# Patient Record
Sex: Male | Born: 1960
Health system: Southern US, Community
[De-identification: ages and names within clinical notes are randomized; demographics above are authoritative.]

## PROBLEM LIST (undated history)

## (undated) DIAGNOSIS — M1990 Unspecified osteoarthritis, unspecified site: Secondary | ICD-10-CM

## (undated) DIAGNOSIS — T7840XA Allergy, unspecified, initial encounter: Secondary | ICD-10-CM

## (undated) DIAGNOSIS — J449 Chronic obstructive pulmonary disease, unspecified: Secondary | ICD-10-CM

## (undated) HISTORY — PX: MASS EXCISION: SHX2000

---

## 1997-06-02 ENCOUNTER — Emergency Department (HOSPITAL_COMMUNITY): Admission: EM | Admit: 1997-06-02 | Discharge: 1997-06-02 | Payer: Self-pay | Admitting: Emergency Medicine

## 2000-07-02 ENCOUNTER — Encounter: Payer: Self-pay | Admitting: Family Medicine

## 2000-07-02 ENCOUNTER — Encounter: Admission: RE | Admit: 2000-07-02 | Discharge: 2000-07-02 | Payer: Self-pay | Admitting: Family Medicine

## 2005-12-25 ENCOUNTER — Ambulatory Visit (HOSPITAL_BASED_OUTPATIENT_CLINIC_OR_DEPARTMENT_OTHER): Admission: RE | Admit: 2005-12-25 | Discharge: 2005-12-25 | Payer: Self-pay | Admitting: Surgery

## 2005-12-25 ENCOUNTER — Encounter (INDEPENDENT_AMBULATORY_CARE_PROVIDER_SITE_OTHER): Payer: Self-pay | Admitting: *Deleted

## 2007-05-17 ENCOUNTER — Emergency Department (HOSPITAL_COMMUNITY): Admission: EM | Admit: 2007-05-17 | Discharge: 2007-05-17 | Payer: Self-pay | Admitting: Emergency Medicine

## 2008-05-27 ENCOUNTER — Emergency Department (HOSPITAL_BASED_OUTPATIENT_CLINIC_OR_DEPARTMENT_OTHER): Admission: EM | Admit: 2008-05-27 | Discharge: 2008-05-27 | Payer: Self-pay | Admitting: Emergency Medicine

## 2008-05-27 ENCOUNTER — Ambulatory Visit: Payer: Self-pay | Admitting: Diagnostic Radiology

## 2010-06-23 NOTE — Op Note (Signed)
NAME:  Cory Jennings, Cory Jennings           ACCOUNT NO.:  [id number]   MEDICAL RECORD NO.:  [id number]          PATIENT TYPE:  AMB   LOCATION:  DSC                          FACILITY:  [facility]   PHYSICIAN:  Wilmon Arms. Corliss Skains, M.D. DATE OF BIRTH:  Feb 08, 1960   DATE OF PROCEDURE:  12/25/2005  DATE OF DISCHARGE:                                 OPERATIVE REPORT   PREOP DIAGNOSIS:  Right posterior shoulder lipoma.   POSTOPERATIVE DIAGNOSIS:  Right posterior shoulder submuscular lipoma.   SURGEON:  Wilmon Arms. Tsuei, M.D.   ANESTHESIA:  General via LMA.   INDICATIONS:  The patient is a healthy 51 year old male who has had a mass  on his posterior right shoulder since before 2002.  He had a CT scan at that  time which showed a lipoma beneath the right latissimus dorsi muscle.  The  patient chose not to have anything done at that time.  However, this has  continued to enlarge and is approximately 12 cm in diameter at this time.  It is uncomfortable and it is unsightly.  The patient requests removal.   DESCRIPTION OF PROCEDURE:  The patient was brought to the operating room and  placed in the supine position on the operating room table.  There was a been  bag in place.  After an adequate level of general anesthesia was obtained,  the patient was rotated so he was lying on his left side.  The beanbag was  molded to hold him in place.  Appropriate padding and straps were also  placed.  His right shoulder was then prepped with Betadine and draped in a  sterile fashion.  He had been given preoperative ciprofloxacin due to his  penicillin allergy.   The area around the lipoma was infiltrated with quarter percent Marcaine.  A  transverse incision was made directly over the palpable mass.  Dissection  was carried down to the fascia.  The fascia of the latissimus dorsi muscle  was opened transversely.  The muscle fibers were divided down to the surface  of the lipoma.  Blunt dissection was used to free up  the lipoma.  The lipoma  was delivered up into the wound.  The posterior surface of the lipoma was  then removed with cautery.  The entire lipoma was sent for pathologic  examination.  There was no rupture of the lipoma capsule.  The wound was  then inspected for hemostasis.   The fascia was closed with a running layer of 0 Vicryl suture.  3-0 Vicryl  was used to close the superficial fascia.  4-0 Monocryl was used to close  the skin in subcuticular fashion.  Prior to the closure a 19 Blake drain was  brought through a separate stab incision and placed in the retromuscular  space.  It was secured with a 2-0 nylon suture and  was placed to bulb suction.  Steri-Strips and clean dressings were applied  to the wound.  The patient was then rotated back to a flat supine position.  He was extubated and brought to the recovery room in stable condition.  All  sponge, instrument,  and needle counts were correct.      Wilmon Arms. Tsuei, M.D.  Electronically Signed     MKT/MEDQ  D:  12/25/2005  T:  12/25/2005  Job:  [id number]   cc:   L. Lupe Carney, M.D.

## 2015-03-10 DIAGNOSIS — M542 Cervicalgia: Secondary | ICD-10-CM | POA: Diagnosis not present

## 2015-03-10 DIAGNOSIS — M4722 Other spondylosis with radiculopathy, cervical region: Secondary | ICD-10-CM | POA: Diagnosis not present

## 2015-03-10 DIAGNOSIS — J439 Emphysema, unspecified: Secondary | ICD-10-CM | POA: Diagnosis not present

## 2015-03-10 DIAGNOSIS — M79641 Pain in right hand: Secondary | ICD-10-CM | POA: Diagnosis not present

## 2015-03-10 DIAGNOSIS — F17210 Nicotine dependence, cigarettes, uncomplicated: Secondary | ICD-10-CM | POA: Diagnosis not present

## 2015-03-10 MED FILL — GABAPENTIN 300 MG CAPSULE: 300 | 30 days supply | Qty: 90 | Fill #0

## 2015-03-10 MED FILL — METHYLPREDNISOLONE 4 MG TAB: 4 | 6 days supply | Qty: 21 | Fill #0

## 2015-04-21 DIAGNOSIS — M542 Cervicalgia: Secondary | ICD-10-CM | POA: Diagnosis not present

## 2015-04-21 DIAGNOSIS — M4722 Other spondylosis with radiculopathy, cervical region: Secondary | ICD-10-CM | POA: Diagnosis not present

## 2015-04-21 DIAGNOSIS — F17210 Nicotine dependence, cigarettes, uncomplicated: Secondary | ICD-10-CM | POA: Diagnosis not present

## 2015-04-21 DIAGNOSIS — M79641 Pain in right hand: Secondary | ICD-10-CM | POA: Diagnosis not present

## 2015-05-30 MED FILL — GABAPENTIN 300 MG CAPSULE: 300 | 30 days supply | Qty: 90 | Fill #1

## 2016-10-19 DIAGNOSIS — H5203 Hypermetropia, bilateral: Secondary | ICD-10-CM | POA: Diagnosis not present

## 2017-07-12 ENCOUNTER — Ambulatory Visit (INDEPENDENT_AMBULATORY_CARE_PROVIDER_SITE_OTHER): Payer: Self-pay | Admitting: Family Medicine

## 2017-07-12 VITALS — BP 105/80 | HR 86 | Temp 97.3°F | Resp 16 | Wt 145.2 lb

## 2017-07-12 DIAGNOSIS — Z0000 Encounter for general adult medical examination without abnormal findings: Secondary | ICD-10-CM

## 2017-07-12 NOTE — Patient Instructions (Signed)

## 2017-07-12 NOTE — Progress Notes (Signed)
Cory Jennings is a 57 y.o. male who presents today with concerns of a need for a physical. He denies any acute concerns but does reports a chronic history of mild COPD.  Review of Systems  Constitutional: Negative for chills, fever and malaise/fatigue.  HENT: Negative for congestion, ear discharge, ear pain, sinus pain and sore throat.   Eyes: Negative.   Respiratory: Negative for cough, sputum production and shortness of breath.   Cardiovascular: Negative.  Negative for chest pain.  Gastrointestinal: Negative for abdominal pain, diarrhea, nausea and vomiting.  Genitourinary: Negative for dysuria, frequency, hematuria and urgency.  Musculoskeletal: Negative for myalgias.  Skin: Negative.   Neurological: Negative for headaches.  Endo/Heme/Allergies: Negative.   Psychiatric/Behavioral: Negative.     O: Vitals:   07/12/17 1544  BP: 105/80  Pulse: 86  Resp: 16  Temp: (!) 97.3 F (36.3 C)  SpO2: 99%     Physical Exam  Constitutional: He is oriented to person, place, and time. Vital signs are normal. He appears well-developed and well-nourished. He is active.  Non-toxic appearance. He does not have a sickly appearance.  HENT:  Head: Normocephalic.  Right Ear: Hearing, tympanic membrane, external ear and ear canal normal.  Left Ear: Hearing, tympanic membrane, external ear and ear canal normal.  Nose: Nose normal.  Mouth/Throat: Uvula is midline and oropharynx is clear and moist.  Neck: Normal range of motion. Neck supple.  Cardiovascular: Normal rate, regular rhythm, normal heart sounds and normal pulses.  Pulmonary/Chest: Effort normal and breath sounds normal.  Abdominal: Soft. Bowel sounds are normal.  Musculoskeletal: Normal range of motion.  Lymphadenopathy:       Head (right side): No submental and no submandibular adenopathy present.       Head (left side): No submental and no submandibular adenopathy present.    He has no cervical adenopathy.  Neurological: He is  alert and oriented to person, place, and time. He has normal strength. No cranial nerve deficit or sensory deficit. He displays a negative Romberg sign.  Balance and strength intact  Psychiatric: He has a normal mood and affect. His speech is normal and behavior is normal. Judgment normal. Cognition and memory are normal.  PHQ-9 - negative  Vitals reviewed.  A: 1. Physical exam    P: Discussed smoking cessation. Exam findings, diagnosis etiology and medication use and indications reviewed with patient. Follow- Up and discharge instructions provided. No emergent/urgent issues found on exam.  Patient verbalized understanding of information provided and agrees with plan of care (POC), all questions answered.  1. Physical exam WNL

## 2017-11-15 MED FILL — PROAIR RESPICLICK INHAL PWD: 108 (90 BAS | 25 days supply | Qty: 1 | Fill #0

## 2018-01-06 ENCOUNTER — Ambulatory Visit (INDEPENDENT_AMBULATORY_CARE_PROVIDER_SITE_OTHER): Payer: Self-pay | Admitting: Family Medicine

## 2018-01-06 VITALS — BP 122/78 | HR 60 | Temp 99.1°F | Resp 20 | Wt 146.8 lb

## 2018-01-06 DIAGNOSIS — R05 Cough: Secondary | ICD-10-CM

## 2018-01-06 DIAGNOSIS — J019 Acute sinusitis, unspecified: Secondary | ICD-10-CM

## 2018-01-06 DIAGNOSIS — R059 Cough, unspecified: Secondary | ICD-10-CM

## 2018-01-06 DIAGNOSIS — Z8709 Personal history of other diseases of the respiratory system: Secondary | ICD-10-CM

## 2018-01-06 MED ORDER — PSEUDOEPH-BROMPHEN-DM 30-2-10 MG/5ML PO SYRP: 10.0000 mL | ORAL_SOLUTION | Freq: Three times a day (TID) | ORAL | 0 refills | Status: DC | PRN

## 2018-01-06 MED ORDER — AZELASTINE HCL 0.1 % NA SOLN: 1.0000 | Freq: Two times a day (BID) | NASAL | 0 refills | Status: DC

## 2018-01-06 MED ORDER — AZELASTINE HCL 0.1 % NA SOLN: 1.0000 | Freq: Two times a day (BID) | NASAL | 0 refills | Status: AC

## 2018-01-06 MED ORDER — DOXYCYCLINE HYCLATE 100 MG PO TABS: 100.0000 mg | ORAL_TABLET | Freq: Two times a day (BID) | ORAL | 0 refills | Status: DC

## 2018-01-06 MED ORDER — DOXYCYCLINE HYCLATE 100 MG PO TABS: 100.0000 mg | ORAL_TABLET | Freq: Two times a day (BID) | ORAL | 0 refills | Status: AC

## 2018-01-06 MED FILL — DOXYCYCLINE HYCLATE 100 MG: 100 | 7 days supply | Qty: 14 | Fill #0

## 2018-01-06 MED FILL — AZELASTINE HCL 137 MCG/SPRA: 137 | 50 days supply | Qty: 30 | Fill #0

## 2018-01-06 MED FILL — BROMPHENIR-PSEUDOEPHED-DM S: 30-2-10 | 4 days supply | Qty: 120 | Fill #0

## 2018-01-06 NOTE — Progress Notes (Signed)
Cory ModenaJoseph M Luallen is a 57 y.o. male who presents today with concerns of cough and congestion that began about 10 days ago. Patient reports that the symptoms started off mild and increasingly progressed. He reports using over the counter cough and congestion medications as well as an Afrin nasal spray with limited relief of symptoms. He denies any fever but does report a history of COPD and occasional forceful sneezing that relieves symptoms.  Review of Systems  Constitutional: Negative for chills, fever and malaise/fatigue.  HENT: Positive for congestion and sinus pain. Negative for ear discharge, ear pain and sore throat.   Eyes: Negative.   Respiratory: Positive for cough. Negative for sputum production and shortness of breath.   Cardiovascular: Negative.  Negative for chest pain.  Gastrointestinal: Negative for abdominal pain, diarrhea, nausea and vomiting.  Genitourinary: Negative for dysuria, frequency, hematuria and urgency.  Musculoskeletal: Negative for myalgias.  Skin: Negative.   Neurological: Negative for headaches.  Endo/Heme/Allergies: Negative.   Psychiatric/Behavioral: Negative.     O: Vitals:   01/06/18 1538  BP: 122/78  Pulse: 60  Resp: 20  Temp: 99.1 F (37.3 C)  SpO2: 98%     Physical Exam  Constitutional: He is oriented to person, place, and time. Vital signs are normal. He appears well-developed and well-nourished. He is active.  Non-toxic appearance. He does not have a sickly appearance.  HENT:  Head: Normocephalic.  Right Ear: Hearing, tympanic membrane, external ear and ear canal normal.  Left Ear: Hearing, tympanic membrane, external ear and ear canal normal.  Nose: Rhinorrhea present. Right sinus exhibits maxillary sinus tenderness and frontal sinus tenderness. Left sinus exhibits maxillary sinus tenderness and frontal sinus tenderness.  Mouth/Throat: Uvula is midline. Posterior oropharyngeal erythema present.  Neck: Normal range of motion. Neck  supple.  Cardiovascular: Normal rate, regular rhythm, normal heart sounds and normal pulses.  Pulmonary/Chest: Effort normal. He has rales in the right upper field, the right middle field, the left upper field and the left middle field.  Abdominal: Soft. Bowel sounds are normal.  Musculoskeletal: Normal range of motion.  Lymphadenopathy:       Head (right side): No submental and no submandibular adenopathy present.       Head (left side): No submental and no submandibular adenopathy present.    He has no cervical adenopathy.  Neurological: He is alert and oriented to person, place, and time.  Psychiatric: He has a normal mood and affect.  Vitals reviewed.   A: 1. Acute non-recurrent sinusitis, unspecified location   2. Cough   3. History of COPD     P: Discussed exam findings, diagnosis etiology and medication use and indications reviewed with patient. Follow- Up and discharge instructions provided. No emergent/urgent issues found on exam.  Patient verbalized understanding of information provided and agrees with plan of care (POC), all questions answered.  1. Acute non-recurrent sinusitis, unspecified location - doxycycline (VIBRA-TABS) 100 MG tablet; Take 1 tablet (100 mg total) by mouth 2 (two) times daily for 7 days. - azelastine (ASTELIN) 0.1 % nasal spray; Place 1 spray into both nostrils 2 (two) times daily. Use in each nostril as directed  2. Cough - brompheniramine-pseudoephedrine-DM 30-2-10 MG/5ML syrup; Take 10 mLs by mouth 3 (three) times daily as needed.  3. History of COPD Currently smoking- no reported maintenance medication

## 2018-01-06 NOTE — Patient Instructions (Signed)

## 2018-04-14 MED FILL — PROAIR RESPICLICK INHAL PWD: 108 (90 BAS | 25 days supply | Qty: 1 | Fill #1

## 2018-12-09 MED FILL — NICOTINE 7 MG/24HR PATCH: 7 | 28 days supply | Qty: 28 | Fill #0

## 2018-12-09 MED FILL — NICOTINE 14 MG/24HR PATCH: 14 | 28 days supply | Qty: 28 | Fill #0

## 2019-07-29 MED FILL — PROAIR RESPICLICK INHAL PWD: 108 (90 BAS | 30 days supply | Qty: 1 | Fill #0

## 2019-11-17 ENCOUNTER — Encounter (HOSPITAL_BASED_OUTPATIENT_CLINIC_OR_DEPARTMENT_OTHER): Payer: Self-pay | Admitting: *Deleted

## 2019-11-17 ENCOUNTER — Other Ambulatory Visit: Payer: Self-pay

## 2019-11-17 DIAGNOSIS — J329 Chronic sinusitis, unspecified: Secondary | ICD-10-CM | POA: Diagnosis not present

## 2019-11-17 DIAGNOSIS — R0981 Nasal congestion: Secondary | ICD-10-CM | POA: Diagnosis not present

## 2019-11-17 DIAGNOSIS — R519 Headache, unspecified: Secondary | ICD-10-CM | POA: Insufficient documentation

## 2019-11-17 NOTE — ED Triage Notes (Signed)
Pt reports 'left sinus pain for one hour'. Pt yelling and agitated in triage.

## 2019-11-18 ENCOUNTER — Other Ambulatory Visit: Payer: Self-pay | Admitting: Family Medicine

## 2019-11-18 ENCOUNTER — Emergency Department (INDEPENDENT_AMBULATORY_CARE_PROVIDER_SITE_OTHER)
Admission: EM | Admit: 2019-11-18 | Discharge: 2019-11-18 | Disposition: A | Discharge status: Home / Self Care | Payer: No Typology Code available for payment source | Source: Home / Self Care | Attending: Family Medicine | Admitting: Family Medicine

## 2019-11-18 ENCOUNTER — Other Ambulatory Visit: Payer: Self-pay

## 2019-11-18 ENCOUNTER — Encounter: Payer: Self-pay | Admitting: Emergency Medicine

## 2019-11-18 ENCOUNTER — Emergency Department (HOSPITAL_BASED_OUTPATIENT_CLINIC_OR_DEPARTMENT_OTHER)
Admission: EM | Admit: 2019-11-18 | Discharge: 2019-11-18 | Disposition: A | Discharge status: Home / Self Care | Payer: No Typology Code available for payment source | Attending: Emergency Medicine | Admitting: Emergency Medicine

## 2019-11-18 DIAGNOSIS — J0101 Acute recurrent maxillary sinusitis: Secondary | ICD-10-CM

## 2019-11-18 DIAGNOSIS — R519 Headache, unspecified: Secondary | ICD-10-CM

## 2019-11-18 HISTORY — DX: Allergy, unspecified, initial encounter: T78.40XA

## 2019-11-18 MED ORDER — DOXYCYCLINE HYCLATE 100 MG PO CAPS: ORAL_CAPSULE | ORAL | 0 refills | Status: DC

## 2019-11-18 MED ORDER — KETOROLAC TROMETHAMINE 60 MG/2ML IM SOLN
30.0000 mg | Freq: Once | INTRAMUSCULAR | Status: AC
  Administered 2019-11-18: 30 mg via INTRAMUSCULAR
  Filled 2019-11-18: qty 2

## 2019-11-18 MED ORDER — ACETAMINOPHEN 500 MG PO TABS
1000.0000 mg | ORAL_TABLET | Freq: Once | ORAL | Status: AC
  Administered 2019-11-18: 1000 mg via ORAL
  Filled 2019-11-18: qty 2

## 2019-11-18 MED ORDER — PREDNISONE 20 MG PO TABS: ORAL_TABLET | ORAL | 0 refills | Status: DC

## 2019-11-18 MED FILL — predniSONE 20 MG TABS: 20 | 8 days supply | Qty: 12 | Fill #0

## 2019-11-18 MED FILL — DOXYCYCLINE HYCLATE 100 MG: 100 | 10 days supply | Qty: 20 | Fill #0

## 2019-11-18 NOTE — ED Triage Notes (Addendum)
Patient c/o sinus pain and sinus congestion x  1 month.   Patient states he had severe pain yesterday, he had to go to ED for treatment. Patient received Toradol and tylenol for pain which helped symptoms per patient statement.   History of allergies.

## 2019-11-18 NOTE — ED Provider Notes (Signed)
[facility]    CSN: [id number] Arrival date & time: 11/18/19  1300      History   Chief Complaint Chief Complaint  Patient presents with  . Nasal Congestion    HPI VARTAN KERINS is a 59 y.o. male.   Patient has seasonal/perennial rhinitis and complains of persistent sinus congestion and facial pain for about a month.  Recently he has developed increased left facial pain.  He denies recent URI symptoms, and no fevers, chills, and sweats. Patient has had no improvement with Flonase nasal spray.  The history is provided by the patient.    Past Medical History:  Diagnosis Date  . Allergies     There are no problems to display for this patient.   History reviewed. No pertinent surgical history.     Home Medications    Prior to Admission medications   Medication Sig Start Date End Date Taking? Authorizing Provider  azelastine (ASTELIN) 0.1 % nasal spray Place 1 spray into both nostrils 2 (two) times daily. Use in each nostril as directed 01/06/18  Yes Zachery Dauer, NP  brompheniramine-pseudoephedrine-DM 30-2-10 MG/5ML syrup Take 10 mLs by mouth 3 (three) times daily as needed. 01/06/18  Yes Zachery Dauer, NP  loratadine (CLARITIN) 10 MG tablet Take 10 mg by mouth daily.   Yes [provider]  doxycycline (VIBRAMYCIN) 100 MG capsule Take one cap PO Q12hr with food. 11/18/19   Lattie Haw, MD  predniSONE (DELTASONE) 20 MG tablet Take one tab by mouth twice daily for 4 days, then one daily. Take with food. 11/18/19   Lattie Haw, MD    Family History History reviewed. No pertinent family history.  Social History Social History   Tobacco Use  . Smoking status: Current Every Day Smoker    Types: Cigarettes  . Smokeless tobacco: Never Used  Substance Use Topics  . Alcohol use: Not Currently  . Drug use: Not Currently     Allergies   Penicillins   Review of Systems Review of Systems No sore throat No cough No pleuritic  pain No wheezing + nasal congestion + post-nasal drainage + sinus pain/pressure No itchy/red eyes No earache No hemoptysis No SOB No fever/chills No nausea No vomiting No abdominal pain No diarrhea No urinary symptoms No skin rash No fatigue No myalgias No headache   Physical Exam Triage Vital Signs ED Triage Vitals  Enc Vitals Group     BP 11/18/19 1325 (!) 155/99     Pulse Rate 11/18/19 1325 (!) 119     Resp 11/18/19 1325 15     Temp 11/18/19 1325 98.1 F (36.7 C)     Temp Source 11/18/19 1325 Oral     SpO2 11/18/19 1325 98 %     Weight --      Height --      Head Circumference --      Peak Flow --      Pain Score 11/18/19 1321 3     Pain Loc --      Pain Edu? --      Excl. in GC? --    No data found.  Updated Vital Signs BP (!) 155/99 (BP Location: Left Arm)   Pulse (!) 119   Temp 98.1 F (36.7 C) (Oral)   Resp 15   SpO2 98%   Visual Acuity Right Eye Distance:   Left Eye Distance:   Bilateral Distance:    Right Eye Near:   Left Eye Near:  Bilateral Near:     Physical Exam Nursing notes and Vital Signs reviewed. Appearance:  Patient appears stated age, and in no acute distress Eyes:  Pupils are equal, round, and reactive to light and accomodation.  Extraocular movement is intact.  Conjunctivae are not inflamed  Ears:  Canals normal.  Tympanic membranes normal.  Nose:  Congested turbinates. Maxillary sinus tenderness is present.  Pharynx:  Normal Neck:  Supple.  No adenopathy  Lungs:  Clear to auscultation.  Breath sounds are equal.  Moving air well. Heart:  Regular rate and rhythm without murmurs, rubs, or gallops.  Abdomen:  Nontender without masses or hepatosplenomegaly.  Bowel sounds are present.  No CVA or flank tenderness.  Extremities:  No edema.  Skin:  No rash present.   UC Treatments / Results  Labs (all labs ordered are listed, but only abnormal results are displayed) Labs Reviewed - No data to display  EKG   Radiology No  results found.  Procedures Procedures (including critical care time)  Medications Ordered in UC Medications - No data to display  Initial Impression / Assessment and Plan / UC Course  I have reviewed the triage vital signs and the nursing notes.  Pertinent labs & imaging results that were available during my care of the patient were reviewed by me and considered in my medical decision making (see chart for details).    Begin doxycycline and prednisone burst/taper. Followup with ENT if not improved two weeks.   Final Clinical Impressions(s) / UC Diagnoses   Final diagnoses:  Acute recurrent maxillary sinusitis     Discharge Instructions     Continue Claritin and Flonase nasal spray daily.    ED Prescriptions    Medication Sig Dispense Auth. Provider   predniSONE (DELTASONE) 20 MG tablet Take one tab by mouth twice daily for 4 days, then one daily. Take with food. 12 tablet Lattie Haw, MD   doxycycline (VIBRAMYCIN) 100 MG capsule Take one cap PO Q12hr with food. 20 capsule Lattie Haw, MD        Lattie Haw, MD 11/22/19 [phone]

## 2019-11-18 NOTE — ED Provider Notes (Signed)
[facility] EMERGENCY DEPARTMENT Provider Note  CSN: [id number] Arrival date & time: 11/17/19 2344  Chief Complaint(s) Facial Pain  HPI Cory Jennings is a 59 y.o. male   The history is provided by the patient.  Headache Pain location:  Frontal Quality:  Dull Severity currently:  8/10 Onset quality:  Gradual Duration: several days. Timing:  Intermittent Chronicity:  Recurrent Context comment:  Working outside in lawn Relieved by:  Nothing Ineffective treatments: allergy meds, afrin. Associated symptoms: congestion, facial pain and sinus pressure   Associated symptoms: no blurred vision, no drainage, no fatigue and no visual change     Past Medical History History reviewed. No pertinent past medical history. There are no problems to display for this patient.  Home Medication(s) Prior to Admission medications   Medication Sig Start Date End Date Taking? Authorizing Provider  azelastine (ASTELIN) 0.1 % nasal spray Place 1 spray into both nostrils 2 (two) times daily. Use in each nostril as directed 01/06/18   Cory Dauer, NP  brompheniramine-pseudoephedrine-DM 30-2-10 MG/5ML syrup Take 10 mLs by mouth 3 (three) times daily as needed. 01/06/18   Cory Dauer, NP  loratadine (CLARITIN) 10 MG tablet Take 10 mg by mouth daily.    [provider]                                                                                                                                    Past Surgical History ** The histories are not reviewed yet. Please review them in the "History" navigator section and refresh this SmartLink. Family History History reviewed. No pertinent family history.  Social History Social History   Tobacco Use  . Smoking status: Not on file  Substance Use Topics  . Alcohol use: Not on file  . Drug use: Not on file   Allergies Penicillins  Review of Systems Review of Systems  Constitutional: Negative for fatigue.  HENT: Positive for  congestion and sinus pressure. Negative for postnasal drip.   Eyes: Negative for blurred vision.  Neurological: Positive for headaches.   All other systems are reviewed and are negative for acute change except as noted in the HPI  Physical Exam Vital Signs  I have reviewed the triage vital signs BP (!) 169/98   Pulse 61   Temp 97.8 F (36.6 C) (Oral)   Resp 19   Ht 5\' 9"  (1.753 m)   Wt 68 kg   SpO2 100%   BMI 22.15 kg/m   Physical Exam Vitals reviewed.  Constitutional:      General: He is not in acute distress.    Appearance: He is well-developed. He is not diaphoretic.  HENT:     Head: Normocephalic and atraumatic.     Jaw: No trismus.     Right Ear: External ear normal.     Left Ear: External ear normal.     Nose:     Right Sinus:  No maxillary sinus tenderness or frontal sinus tenderness.     Left Sinus: Frontal sinus tenderness present. No maxillary sinus tenderness.  Eyes:     General: No scleral icterus.    Conjunctiva/sclera: Conjunctivae normal.  Neck:     Trachea: Phonation normal.  Cardiovascular:     Rate and Rhythm: Normal rate and regular rhythm.  Pulmonary:     Effort: Pulmonary effort is normal. No respiratory distress.     Breath sounds: No stridor.  Abdominal:     General: There is no distension.  Musculoskeletal:        General: Normal range of motion.     Cervical back: Normal range of motion.  Neurological:     Mental Status: He is alert and oriented to person, place, and time.     Comments: Mental Status:  Alert and oriented to person, place, and time.  Attention and concentration normal.  Speech clear.  Recent memory is intact  Cranial Nerves:  II Visual Fields: Intact to confrontation. Visual fields intact. III, IV, VI: Pupils equal and reactive to light and near. Full eye movement without nystagmus  V Facial Sensation: Normal. No weakness of masticatory muscles  VII: No facial weakness or asymmetry  VIII Auditory Acuity: Grossly  normal  IX/X: The uvula is midline; the palate elevates symmetrically  XI: Normal sternocleidomastoid and trapezius strength  XII: The tongue is midline. No atrophy or fasciculations.   Motor System: Muscle Strength: 5/5 and symmetric in the upper and lower extremities. No pronation or drift.  Muscle Tone: Tone and muscle bulk are normal in the upper and lower extremities.   Reflexes: DTRs: 1+ and symmetrical in all four extremities. No Clonus Coordination: Intact finger-to-nose.  No tremor.  Sensation: Intact to light touch  Gait: Routine  gait normal.   Psychiatric:        Behavior: Behavior normal.     ED Results and Treatments Labs (all labs ordered are listed, but only abnormal results are displayed) Labs Reviewed - No data to display                                                                                                                       EKG  EKG Interpretation  Date/Time:    Ventricular Rate:    PR Interval:    QRS Duration:   QT Interval:    QTC Calculation:   R Axis:     Text Interpretation:        Radiology No results found.  Pertinent labs & imaging results that were available during my care of the patient were reviewed by me and considered in my medical decision making (see chart for details).  Medications Ordered in ED Medications  ketorolac (TORADOL) injection 30 mg (30 mg Intramuscular Given 11/18/19 0201)  acetaminophen (TYLENOL) tablet 1,000 mg (1,000 mg Oral Given 11/18/19 0200)  Procedures Procedures  (including critical care time)  Medical Decision Making / ED Course I have reviewed the nursing notes for this encounter and the patient's prior records (if available in EHR or on provided paperwork).   Cory Jennings was evaluated in Emergency Department on 11/18/2019 for the symptoms described in the  history of present illness. He was evaluated in the context of the global COVID-19 pandemic, which necessitated consideration that the patient might be at risk for infection with the SARS-CoV-2 virus that causes COVID-19. Institutional protocols and algorithms that pertain to the evaluation of patients at risk for COVID-19 are in a state of rapid change based on information released by regulatory bodies including the CDC and federal and state organizations. These policies and algorithms were followed during the patient's care in the ED.  Typical  headache for the pt. Believes it is sinusitis. Non focal neuro exam. No recent head trauma. No fever. Doubt meningitis. Doubt intracranial bleed. Doubt IIH. No indication for imaging.   Pain significantly improved with Tylenol and Toradol.  Noted to have elevated BP. Likely related to Afrin use just PTA.          Final Clinical Impression(s) / ED Diagnoses Final diagnoses:  Bad headache    The patient appears reasonably screened and/or stabilized for discharge and I doubt any other medical condition or other [facility] requiring further screening, evaluation, or treatment in the ED at this time prior to discharge. Safe for discharge with strict return precautions.  Disposition: Discharge  Condition: Good  I have discussed the results, Dx and Tx plan with the patient/family who expressed understanding and agree(s) with the plan. Discharge instructions discussed at length. The patient/family was given strict return precautions who verbalized understanding of the instructions. No further questions at time of discharge.    ED Discharge Orders    None       Follow Up: Irena Reichmann, DO [address] [place] [address] [phone]  Schedule an appointment as soon as possible for a visit  As needed     This chart was dictated using voice recognition software.  Despite best efforts to proofread,  errors can occur which can  change the documentation meaning.   Nira Conn, MD 11/18/19 0330

## 2019-11-18 NOTE — Discharge Instructions (Addendum)
Continue Claritin and Flonase nasal spray daily.

## 2020-03-29 ENCOUNTER — Other Ambulatory Visit (HOSPITAL_COMMUNITY): Payer: Self-pay | Admitting: Family Medicine

## 2020-04-15 ENCOUNTER — Other Ambulatory Visit (HOSPITAL_COMMUNITY): Payer: Self-pay | Admitting: Family Medicine

## 2020-04-16 MED FILL — ALBUTEROL SULFATE HFA 108 (: 108 (90 BAS | 30 days supply | Qty: 18 | Fill #0

## 2020-04-28 LAB — COLOGUARD: COLOGUARD: NEGATIVE

## 2020-06-06 ENCOUNTER — Emergency Department
Admission: EM | Admit: 2020-06-06 | Discharge: 2020-06-06 | Disposition: A | Discharge status: Home / Self Care | Payer: No Typology Code available for payment source | Source: Home / Self Care | Attending: Family Medicine | Admitting: Family Medicine

## 2020-06-06 ENCOUNTER — Other Ambulatory Visit (HOSPITAL_COMMUNITY): Payer: Self-pay

## 2020-06-06 ENCOUNTER — Other Ambulatory Visit: Payer: Self-pay

## 2020-06-06 DIAGNOSIS — J3489 Other specified disorders of nose and nasal sinuses: Secondary | ICD-10-CM

## 2020-06-06 DIAGNOSIS — J0100 Acute maxillary sinusitis, unspecified: Secondary | ICD-10-CM | POA: Diagnosis not present

## 2020-06-06 DIAGNOSIS — R0982 Postnasal drip: Secondary | ICD-10-CM

## 2020-06-06 MED ORDER — PREDNISONE 20 MG PO TABS
40.0000 mg | ORAL_TABLET | Freq: Every day | ORAL | 0 refills | Status: AC
  Filled 2020-06-06: qty 10, 5d supply, fill #0

## 2020-06-06 MED ORDER — FEXOFENADINE HCL 180 MG PO TABS
180.0000 mg | ORAL_TABLET | Freq: Every day | ORAL | 0 refills | Status: AC
  Filled 2020-06-06: qty 15, 15d supply, fill #0

## 2020-06-06 MED ORDER — DOXYCYCLINE HYCLATE 100 MG PO CAPS
100.0000 mg | ORAL_CAPSULE | Freq: Two times a day (BID) | ORAL | 0 refills | Status: DC
  Filled 2020-06-06: qty 20, 10d supply, fill #0

## 2020-06-06 NOTE — ED Triage Notes (Signed)
Pt  Sinus issues with PMH  sinus infections. 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 Pt st he has been taking OTC antihistamines and nasal spray . Thinks he may need a referral to ENT?

## 2020-06-06 NOTE — ED Provider Notes (Signed)
[facility]    CSN: [id number] Arrival date & time: 06/06/20  1431      History   Chief Complaint Chief Complaint  Patient presents with  . Facial Pain    HPI Cory Jennings is a 60 y.o. male.   HPI pleasant 60 year old male presents with sinus and nasal congestion, sinus pressure, and postnasal drip/drainage for 5 days.  Reports bilateral sinus congestion of forehead, bilateral upper nose area, and behind both eyes for these 5 days.  He endorses infrequent headaches that typically resolve on their own.  Past Medical History:  Diagnosis Date  . Allergies     There are no problems to display for this patient.   History reviewed. No pertinent surgical history.     Home Medications    Prior to Admission medications   Medication Sig Start Date End Date Taking? Authorizing Provider  doxycycline (VIBRAMYCIN) 100 MG capsule Take 1 capsule (100 mg total) by mouth 2 (two) times daily. 06/06/20  Yes Trevor Iha, FNP  fexofenadine [facility] ALLERGY) 180 MG tablet Take 1 tablet (180 mg total) by mouth daily for 7 days. 06/06/20 06/13/20 Yes Trevor Iha, FNP  predniSONE (DELTASONE) 20 MG tablet Take 2 tabs PO daily for 5 days. 06/06/20 06/10/20 Yes Trevor Iha, FNP  albuterol (VENTOLIN HFA) 108 (90 Base) MCG/ACT inhaler INHALE ONE PUFF BY MOUTH EVERY 4 HOURS AS NEEDED 04/15/20 04/15/21  Irena Reichmann, DO  Albuterol Sulfate (PROAIR RESPICLICK) 108 (90 Base) MCG/ACT AEPB INHALE 2 PUFF BY MOUTH EVERY 4 HOURS AS NEEDED 03/29/20 03/29/21  Irena Reichmann, DO  azelastine (ASTELIN) 0.1 % nasal spray Place 1 spray into both nostrils 2 (two) times daily. Use in each nostril as directed 01/06/18   Zachery Dauer, NP  brompheniramine-pseudoephedrine-DM 30-2-10 MG/5ML syrup Take 10 mLs by mouth 3 (three) times daily as needed. 01/06/18   Zachery Dauer, NP  loratadine (CLARITIN) 10 MG tablet Take 10 mg by mouth daily.    [provider]    Family History Family History  Problem  Relation Age of Onset  . Hypertension Mother     Social History Social History   Tobacco Use  . Smoking status: Current Every Day Smoker    Types: Cigarettes  . Smokeless tobacco: Never Used  . Tobacco comment: 2-3 ciggerates a day   Substance Use Topics  . Alcohol use: Not Currently  . Drug use: Not Currently     Allergies   Penicillins   Review of Systems Review of Systems  Constitutional: Negative.   HENT: Positive for congestion, postnasal drip, sinus pressure and sinus pain.   Eyes: Negative.   Respiratory: Negative.   Cardiovascular: Negative.   Gastrointestinal: Negative.   Neurological: Negative.      Physical Exam Triage Vital Signs ED Triage Vitals  Enc Vitals Group     BP 06/06/20 1446 (!) 142/77     Pulse Rate 06/06/20 1446 82     Resp 06/06/20 1446 15     Temp 06/06/20 1446 98.1 F (36.7 C)     Temp Source 06/06/20 1446 Oral     SpO2 06/06/20 1446 94 %     Weight 06/06/20 1442 160 lb (72.6 kg)     Height 06/06/20 1442 5\' 10"  (1.778 m)     Head Circumference --      Peak Flow --      Pain Score 06/06/20 1442 0     Pain Loc --      Pain Edu? --  Excl. in GC? --    No data found.  Updated Vital Signs BP (!) 142/77   Pulse 82   Temp 98.1 F (36.7 C) (Oral)   Resp 15   Ht 5\' 10"  (1.778 m)   Wt 160 lb (72.6 kg)   SpO2 94%   BMI 22.96 kg/m    Physical Exam Vitals and nursing note reviewed.  Constitutional:      Appearance: Normal appearance.  HENT:     Head: Normocephalic and atraumatic.     Right Ear: Hearing, tympanic membrane and ear canal normal.     Left Ear: Hearing, tympanic membrane and ear canal normal.     Nose:     Right Turbinates: Enlarged.     Left Turbinates: Enlarged.     Right Sinus: Maxillary sinus tenderness and frontal sinus tenderness present.     Left Sinus: Maxillary sinus tenderness and frontal sinus tenderness present.     Mouth/Throat:     Lips: Pink.     Mouth: Mucous membranes are moist.      Pharynx: Oropharynx is clear. Uvula midline. No pharyngeal swelling, oropharyngeal exudate or posterior oropharyngeal erythema.  Cardiovascular:     Rate and Rhythm: Normal rate and regular rhythm.     Pulses: Normal pulses.     Heart sounds: Normal heart sounds.  Pulmonary:     Effort: Pulmonary effort is normal.     Breath sounds: Normal breath sounds.  Musculoskeletal:     Cervical back: Normal range of motion and neck supple.  Lymphadenopathy:     Cervical: No cervical adenopathy.  Skin:    General: Skin is warm and dry.  Neurological:     General: No focal deficit present.     Mental Status: He is alert and oriented to person, place, and time.  Psychiatric:        Mood and Affect: Mood normal.        Behavior: Behavior normal.      UC Treatments / Results  Labs (all labs ordered are listed, but only abnormal results are displayed) Labs Reviewed - No data to display  EKG   Radiology No results found.  Procedures Procedures (including critical care time)  Medications Ordered in UC Medications - No data to display  Initial Impression / Assessment and Plan / UC Course  I have reviewed the triage vital signs and the nursing notes.  Pertinent labs & imaging results that were available during my care of the patient were reviewed by me and considered in my medical decision making (see chart for details).     1.  Acute sinusitis, 2.  Sinus pressure, 3.  Allergic rhinitis.  Advised patient to take antibiotic and prednisone to completion.  Advised patient do not use previously prescribed Astelin spray, or Claritin while taking fexofenadine 180 mg daily for the next 5 to 7 days. Final Clinical Impressions(s) / UC Diagnoses   Final diagnoses:  Acute maxillary sinusitis, recurrence not specified  PND (post-nasal drip)  Sinus pressure     Discharge Instructions     Advised patient to take antibiotic and prednisone with food daily for the next 5 to 7 days.  Increase  daily water intake while taking these medications.  Advised patient to take Allegra 180 mg without the for the next 7 days then as needed.    ED Prescriptions    Medication Sig Dispense Auth. Provider   doxycycline (VIBRAMYCIN) 100 MG capsule Take 1 capsule (100 mg total) by  mouth 2 (two) times daily. 20 capsule Trevor Iha, FNP   fexofenadine [facility] ALLERGY) 180 MG tablet Take 1 tablet (180 mg total) by mouth daily for 7 days. 15 tablet Trevor Iha, FNP   predniSONE (DELTASONE) 20 MG tablet Take 2 tabs PO daily for 5 days. 10 tablet Trevor Iha, FNP     PDMP not reviewed this encounter.   Trevor Iha, FNP 06/06/20 1547

## 2020-06-06 NOTE — Discharge Instructions (Addendum)
Advised patient to take antibiotic and prednisone with food daily for the next 5 to 7 days.  Increase daily water intake while taking these medications.  Advised patient to take Allegra 180 mg without the for the next 7 days then as needed.

## 2020-08-12 ENCOUNTER — Emergency Department (INDEPENDENT_AMBULATORY_CARE_PROVIDER_SITE_OTHER): Payer: No Typology Code available for payment source

## 2020-08-12 ENCOUNTER — Emergency Department
Admission: EM | Admit: 2020-08-12 | Discharge: 2020-08-12 | Disposition: A | Discharge status: Home / Self Care | Payer: No Typology Code available for payment source | Source: Home / Self Care | Attending: Family Medicine | Admitting: Family Medicine

## 2020-08-12 ENCOUNTER — Other Ambulatory Visit (HOSPITAL_COMMUNITY): Payer: Self-pay

## 2020-08-12 ENCOUNTER — Other Ambulatory Visit: Payer: Self-pay

## 2020-08-12 DIAGNOSIS — M5412 Radiculopathy, cervical region: Secondary | ICD-10-CM

## 2020-08-12 DIAGNOSIS — M542 Cervicalgia: Secondary | ICD-10-CM

## 2020-08-12 HISTORY — DX: Unspecified osteoarthritis, unspecified site: M19.90

## 2020-08-12 HISTORY — DX: Chronic obstructive pulmonary disease, unspecified: J44.9

## 2020-08-12 MED ORDER — PREDNISONE 20 MG PO TABS
ORAL_TABLET | ORAL | 0 refills | Status: DC
  Filled 2020-08-12: qty 12, 8d supply, fill #0

## 2020-08-12 NOTE — ED Provider Notes (Signed)
[facility]    CSN: [id number] Arrival date & time: 08/12/20  1140      History   Chief Complaint Chief Complaint  Patient presents with   Neck Pain    HPI Cory Jennings is a 60 y.o. male.   Patient states that he developed pain and mild swelling in his left neck four days ago.  He denies trauma but states that he has been "sleeping on it wrong."  Since then he has had a persistent tingling/burning sensation in both hands, worse on the left.  He has a history of recurring neck pain having been told in the past that he has three cervical vertebra that are "out of line."  The history is provided by the patient.  Neck Pain Associated symptoms: numbness   Associated symptoms: no fever    Past Medical History:  Diagnosis Date   Allergies    Arthritis    COPD (chronic obstructive pulmonary disease) (HCC)     There are no problems to display for this patient.   Past Surgical History:  Procedure Laterality Date   MASS EXCISION Right    shoulder blade       Home Medications    Prior to Admission medications   Medication Sig Start Date End Date Taking? Authorizing Provider  acetaminophen (TYLENOL) 325 MG tablet Take 650 mg by mouth every 6 (six) hours as needed.   Yes [provider]  predniSONE (DELTASONE) 20 MG tablet Take one tab by mouth twice daily for 4 days, then one daily. Take with food. 08/12/20  Yes Lattie Haw, MD  albuterol (VENTOLIN HFA) 108 (90 Base) MCG/ACT inhaler INHALE ONE PUFF BY MOUTH EVERY 4 HOURS AS NEEDED 04/15/20 04/15/21  Irena Reichmann, DO  Albuterol Sulfate (PROAIR RESPICLICK) 108 (90 Base) MCG/ACT AEPB INHALE 2 PUFF BY MOUTH EVERY 4 HOURS AS NEEDED 03/29/20 03/29/21  Irena Reichmann, DO  azelastine (ASTELIN) 0.1 % nasal spray Place 1 spray into both nostrils 2 (two) times daily. Use in each nostril as directed Patient taking differently: Place 1 spray into both nostrils 2 (two) times daily as needed. Use in each nostril as  directed 01/06/18   Zachery Dauer, NP  brompheniramine-pseudoephedrine-DM 30-2-10 MG/5ML syrup Take 10 mLs by mouth 3 (three) times daily as needed. 01/06/18   Zachery Dauer, NP  doxycycline (VIBRAMYCIN) 100 MG capsule Take 1 capsule (100 mg total) by mouth 2 (two) times daily. 06/06/20   Trevor Iha, FNP  fexofenadine [facility] ALLERGY) 180 MG tablet Take 1 tablet (180 mg total) by mouth daily for 7 days. 06/06/20 06/13/20  Trevor Iha, FNP  loratadine (CLARITIN) 10 MG tablet Take 10 mg by mouth daily.  06/06/20  [provider]    Family History Family History  Problem Relation Age of Onset   Hypertension Mother    Alcoholism Father     Social History Social History   Tobacco Use   Smoking status: Every Day    Packs/day: 0.25    Years: 40.00    Pack years: 10.00    Types: Cigarettes   Smokeless tobacco: Never   Tobacco comments:    2-3 ciggerates a day   Vaping Use   Vaping Use: Never used  Substance Use Topics   Alcohol use: Not Currently   Drug use: Not Currently     Allergies   Penicillins   Review of Systems Review of Systems  Constitutional:  Negative for activity change, appetite change, chills, diaphoresis, fatigue, fever  and unexpected weight change.  HENT: Negative.    Eyes: Negative.   Respiratory: Negative.    Cardiovascular: Negative.   Genitourinary: Negative.   Musculoskeletal:  Positive for neck pain.  Skin:  Negative for rash.  Neurological:  Positive for numbness.  Hematological:  Negative for adenopathy.    Physical Exam Triage Vital Signs ED Triage Vitals  Enc Vitals Group     BP 08/12/20 1227 (!) 156/85     Pulse Rate 08/12/20 1227 66     Resp 08/12/20 1227 20     Temp 08/12/20 1227 98.8 F (37.1 C)     Temp Source 08/12/20 1227 Oral     SpO2 08/12/20 1227 100 %     Weight 08/12/20 1224 163 lb (73.9 kg)     Height 08/12/20 1224 5\' 10"  (1.778 m)     Head Circumference --      Peak Flow --      Pain Score 08/12/20 1223 7      Pain Loc --      Pain Edu? --      Excl. in GC? --    No data found.  Updated Vital Signs BP (!) 156/85 (BP Location: Right Arm)   Pulse 66   Temp 98.8 F (37.1 C) (Oral)   Resp 20   Ht 5\' 10"  (1.778 m)   Wt 73.9 kg   SpO2 100%   BMI 23.39 kg/m   Visual Acuity Right Eye Distance:   Left Eye Distance:   Bilateral Distance:    Right Eye Near:   Left Eye Near:    Bilateral Near:     Physical Exam Vitals and nursing note reviewed.  Constitutional:      General: He is not in acute distress. HENT:     Head: Normocephalic.     Right Ear: External ear normal.     Left Ear: External ear normal.     Nose: Nose normal.     Mouth/Throat:     Mouth: Mucous membranes are moist.  Eyes:     Conjunctiva/sclera: Conjunctivae normal.     Pupils: Pupils are equal, round, and reactive to light.  Neck:      Comments: Neck has diffuse mild tenderness to palpation over the left trapezius muscle.  Neck extension and flexion of the neck to the left laterally causes a burning sensation and paresthesias in his left arm. Cardiovascular:     Rate and Rhythm: Normal rate and regular rhythm.     Heart sounds: Normal heart sounds.  Pulmonary:     Breath sounds: Normal breath sounds.  Abdominal:     Tenderness: There is no abdominal tenderness.  Musculoskeletal:     Cervical back: Normal range of motion and neck supple. Tenderness present. Muscular tenderness present.     Right lower leg: No edema.     Left lower leg: No edema.  Lymphadenopathy:     Cervical: No cervical adenopathy.  Skin:    General: Skin is warm and dry.     Findings: No rash.  Neurological:     Mental Status: He is alert and oriented to person, place, and time.     UC Treatments / Results  Labs (all labs ordered are listed, but only abnormal results are displayed) Labs Reviewed - No data to display  EKG   Radiology DG Cervical Spine Complete  Result Date: 08/12/2020 CLINICAL DATA:  Left neck pain into left  shoulder EXAM: CERVICAL SPINE - COMPLETE 4+  VIEW COMPARISON:  None. FINDINGS: Decreased osseous mineralization. No prevertebral soft tissue swelling. Minor degenerative listhesis. Multilevel degenerative endplate irregularity. Multilevel disc space narrowing. Facet and uncovertebral hypertrophy are present and encroach on the neural foramina bilaterally. IMPRESSION: Moderate to marked cervical spondylosis. Electronically Signed   By: Guadlupe Spanish M.D.   On: 08/12/2020 14:48    Procedures Procedures (including critical care time)  Medications Ordered in UC Medications - No data to display  Initial Impression / Assessment and Plan / UC Course  I have reviewed the triage vital signs and the nursing notes.  Pertinent labs & imaging results that were available during my care of the patient were reviewed by me and considered in my medical decision making (see chart for details).    Begin prednisone burst/taper. Dispensed soft cervical collar. Followup with Dr[facility] Rodney [facility] In about one week.  Final Clinical Impressions(s) / UC Diagnoses   Final diagnoses:  Cervical radiculopathy     Discharge Instructions      Apply ice pack to back of neck for 20 to 30 minutes, 3 to 4 times daily  Continue until pain decreases.  Wear soft cervical collar at night.  May take extra strength Tylenol, 2 tabs twice daily as needed.     ED Prescriptions     Medication Sig Dispense Auth. Provider   predniSONE (DELTASONE) 20 MG tablet Take one tab by mouth twice daily for 4 days, then one daily. Take with food. 12 tablet Lattie Haw, MD         Lattie Haw, MD 08/16/20 [phone]

## 2020-08-12 NOTE — ED Triage Notes (Signed)
Pt presents to Urgent Care with c/o neck pain/stiffness x 3 days. Reports "sleeping on it wrong." Has hx of neck pain and was told he has 3 cervical vertebra "out of line."

## 2020-08-12 NOTE — Discharge Instructions (Addendum)
Apply ice pack to back of neck for 20 to 30 minutes, 3 to 4 times daily  Continue until pain decreases.  Wear soft cervical collar at night.  May take extra strength Tylenol, 2 tabs twice daily as needed.

## 2020-12-15 ENCOUNTER — Other Ambulatory Visit (HOSPITAL_COMMUNITY): Payer: Self-pay

## 2020-12-15 MED ORDER — NICOTINE 7 MG/24HR TD PT24
MEDICATED_PATCH | TRANSDERMAL | 2 refills | Status: DC
  Filled 2020-12-15: qty 28, 28d supply, fill #0

## 2020-12-16 ENCOUNTER — Other Ambulatory Visit (HOSPITAL_COMMUNITY): Payer: Self-pay

## 2020-12-16 ENCOUNTER — Other Ambulatory Visit: Payer: Self-pay | Admitting: Family Medicine

## 2020-12-16 DIAGNOSIS — F17200 Nicotine dependence, unspecified, uncomplicated: Secondary | ICD-10-CM

## 2020-12-16 DIAGNOSIS — J449 Chronic obstructive pulmonary disease, unspecified: Secondary | ICD-10-CM

## 2021-06-23 ENCOUNTER — Other Ambulatory Visit (HOSPITAL_COMMUNITY): Payer: Self-pay

## 2021-06-23 MED ORDER — CYCLOBENZAPRINE HCL 5 MG PO TABS
ORAL_TABLET | ORAL | 1 refills | Status: DC
  Filled 2021-06-23: qty 60, 30d supply, fill #0

## 2021-12-20 ENCOUNTER — Other Ambulatory Visit (HOSPITAL_COMMUNITY): Payer: Self-pay

## 2021-12-25 ENCOUNTER — Other Ambulatory Visit (HOSPITAL_COMMUNITY): Payer: Self-pay

## 2021-12-26 ENCOUNTER — Other Ambulatory Visit (HOSPITAL_COMMUNITY): Payer: Self-pay

## 2021-12-26 MED ORDER — DICLOFENAC SODIUM 75 MG PO TBEC
75.0000 mg | DELAYED_RELEASE_TABLET | Freq: Every day | ORAL | 1 refills | Status: DC
  Filled 2021-12-26: qty 90, 90d supply, fill #0

## 2022-06-13 ENCOUNTER — Other Ambulatory Visit (HOSPITAL_COMMUNITY): Payer: Self-pay

## 2022-06-13 MED ORDER — IBUPROFEN 800 MG PO TABS
800.0000 mg | ORAL_TABLET | Freq: Four times a day (QID) | ORAL | 0 refills | Status: DC | PRN
  Filled 2022-06-13: qty 12, 3d supply, fill #0

## 2022-06-13 MED ORDER — AZITHROMYCIN 250 MG PO TABS
ORAL_TABLET | ORAL | 0 refills | Status: DC
  Filled 2022-06-13: qty 6, 5d supply, fill #0

## 2022-07-31 ENCOUNTER — Other Ambulatory Visit (HOSPITAL_COMMUNITY): Payer: Self-pay

## 2022-07-31 DIAGNOSIS — R7989 Other specified abnormal findings of blood chemistry: Secondary | ICD-10-CM | POA: Diagnosis not present

## 2022-07-31 DIAGNOSIS — R739 Hyperglycemia, unspecified: Secondary | ICD-10-CM | POA: Diagnosis not present

## 2022-07-31 DIAGNOSIS — M5136 Other intervertebral disc degeneration, lumbar region: Secondary | ICD-10-CM | POA: Diagnosis not present

## 2022-07-31 DIAGNOSIS — M5410 Radiculopathy, site unspecified: Secondary | ICD-10-CM | POA: Diagnosis not present

## 2022-08-01 ENCOUNTER — Other Ambulatory Visit (HOSPITAL_COMMUNITY): Payer: Self-pay

## 2022-08-01 MED ORDER — DICLOFENAC SODIUM 75 MG PO TBEC
75.0000 mg | DELAYED_RELEASE_TABLET | Freq: Every day | ORAL | 3 refills | Status: DC
  Filled 2022-08-01: qty 30, 30d supply, fill #0

## 2022-08-01 MED ORDER — CYCLOBENZAPRINE HCL 5 MG PO TABS
5.0000 mg | ORAL_TABLET | Freq: Every evening | ORAL | 3 refills | Status: DC | PRN
  Filled 2022-08-01: qty 60, 30d supply, fill #0

## 2022-08-13 ENCOUNTER — Other Ambulatory Visit (HOSPITAL_COMMUNITY): Payer: Self-pay

## 2022-08-13 MED ORDER — PREDNISONE 10 MG PO TABS
10.0000 mg | ORAL_TABLET | ORAL | 0 refills | Status: DC
  Filled 2022-08-13: qty 32, 14d supply, fill #0

## 2022-08-14 ENCOUNTER — Other Ambulatory Visit: Payer: Self-pay | Admitting: Family Medicine

## 2022-08-14 DIAGNOSIS — M5136 Other intervertebral disc degeneration, lumbar region: Secondary | ICD-10-CM

## 2022-08-14 DIAGNOSIS — M47816 Spondylosis without myelopathy or radiculopathy, lumbar region: Secondary | ICD-10-CM

## 2022-08-14 DIAGNOSIS — M5410 Radiculopathy, site unspecified: Secondary | ICD-10-CM

## 2022-08-19 ENCOUNTER — Ambulatory Visit
Admission: RE | Admit: 2022-08-19 | Discharge: 2022-08-19 | Disposition: A | Discharge status: Home / Self Care | Payer: 59 | Source: Ambulatory Visit | Attending: Family Medicine | Admitting: Family Medicine

## 2022-08-19 DIAGNOSIS — M5126 Other intervertebral disc displacement, lumbar region: Secondary | ICD-10-CM | POA: Diagnosis not present

## 2022-08-19 DIAGNOSIS — M47816 Spondylosis without myelopathy or radiculopathy, lumbar region: Secondary | ICD-10-CM | POA: Diagnosis not present

## 2022-08-19 DIAGNOSIS — M5136 Other intervertebral disc degeneration, lumbar region: Secondary | ICD-10-CM

## 2022-08-19 DIAGNOSIS — M5410 Radiculopathy, site unspecified: Secondary | ICD-10-CM

## 2022-09-04 ENCOUNTER — Other Ambulatory Visit (HOSPITAL_COMMUNITY): Payer: Self-pay

## 2022-09-04 MED ORDER — GABAPENTIN 300 MG PO CAPS
300.0000 mg | ORAL_CAPSULE | Freq: Three times a day (TID) | ORAL | 1 refills | Status: DC | PRN
  Filled 2022-09-04: qty 90, 30d supply, fill #0

## 2022-09-28 ENCOUNTER — Other Ambulatory Visit (HOSPITAL_COMMUNITY): Payer: Self-pay

## 2022-09-28 DIAGNOSIS — M5416 Radiculopathy, lumbar region: Secondary | ICD-10-CM | POA: Diagnosis not present

## 2022-09-28 DIAGNOSIS — M5451 Vertebrogenic low back pain: Secondary | ICD-10-CM | POA: Diagnosis not present

## 2022-09-28 MED ORDER — TRAMADOL HCL 50 MG PO TABS
50.0000 mg | ORAL_TABLET | Freq: Three times a day (TID) | ORAL | 0 refills | Status: AC | PRN
  Filled 2022-09-28: qty 30, 10d supply, fill #0

## 2022-09-29 ENCOUNTER — Other Ambulatory Visit (HOSPITAL_COMMUNITY): Payer: Self-pay

## 2022-10-03 DIAGNOSIS — M5416 Radiculopathy, lumbar region: Secondary | ICD-10-CM | POA: Diagnosis not present

## 2022-10-17 DIAGNOSIS — M5416 Radiculopathy, lumbar region: Secondary | ICD-10-CM | POA: Diagnosis not present

## 2022-10-17 DIAGNOSIS — M5451 Vertebrogenic low back pain: Secondary | ICD-10-CM | POA: Diagnosis not present

## 2022-10-26 ENCOUNTER — Ambulatory Visit (HOSPITAL_COMMUNITY): Payer: Self-pay | Admitting: Orthopedic Surgery

## 2022-10-30 NOTE — Pre-Procedure Instructions (Signed)
Surgical Instructions   Your procedure is scheduled on Monday, September 30th. Report to [facility] Main Entrance "A" at 10:35 A.M., then check in with the Admitting office. Any questions or running late day of surgery: call [phone]  Questions prior to your surgery date: call [phone], Monday-Friday, 8am-4pm. If you experience any cold or flu symptoms such as cough, fever, chills, shortness of breath, etc. between now and your scheduled surgery, please notify us at the above number.     Remember:  Do not eat after midnight the night before your surgery  You may drink clear liquids until 09:35 AM the morning of your surgery.   Clear liquids allowed are: Water, Non-Citrus Juices (without pulp), Carbonated Beverages, Clear Tea, Black Coffee Only (NO MILK, CREAM OR POWDERED CREAMER of any kind), and Gatorade.    Take these medicines the morning of surgery with A SIP OF WATER: May take these medicines IF NEEDED: acetaminophen (TYLENOL)  albuterol (VENTOLIN HFA)- bring inhaler with you on day of surgery azelastine (ASTELIN) nasal spray fexofenadine (ALLEGRA ALLERGY)  gabapentin (NEURONTIN)   One week prior to surgery, STOP taking any Aspirin (unless otherwise instructed by your surgeon) Aleve, Naproxen, Ibuprofen, Motrin, Advil, Goody's, BC's, all herbal medications, fish oil, and non-prescription vitamins.                     Do NOT Smoke (Tobacco/Vaping) for 24 hours prior to your procedure.  If you use a CPAP at night, you may bring your mask/headgear for your overnight stay.   You will be asked to remove any contacts, glasses, piercing's, hearing aid's, dentures/partials prior to surgery. Please bring cases for these items if needed.    Patients discharged the day of surgery will not be allowed to drive home, and someone needs to stay with them for 24 hours.  SURGICAL WAITING ROOM VISITATION Patients may have no more than 2 support people in the waiting area - these  visitors may rotate.   Pre-op nurse will coordinate an appropriate time for 1 ADULT support person, who may not rotate, to accompany patient in pre-op.  Children under the age of 46 must have an adult with them who is not the patient and must remain in the main waiting area with an adult.  If the patient needs to stay at the hospital during part of their recovery, the visitor guidelines for inpatient rooms apply.  Please refer to the [facility] website for the visitor guidelines for any additional information.   If you received a COVID test during your pre-op visit  it is requested that you wear a mask when out in public, stay away from anyone that may not be feeling well and notify your surgeon if you develop symptoms. If you have been in contact with anyone that has tested positive in the last 10 days please notify you surgeon.      Pre-operative 5 CHG Bathing Instructions   You can play a key role in reducing the risk of infection after surgery. Your skin needs to be as free of germs as possible. You can reduce the number of germs on your skin by washing with CHG (chlorhexidine gluconate) soap before surgery. CHG is an antiseptic soap that kills germs and continues to kill germs even after washing.   DO NOT use if you have an allergy to chlorhexidine/CHG or antibacterial soaps. If your skin becomes reddened or irritated, stop using the CHG and notify one of our RNs at [phone].  Please shower with the CHG soap starting 4 days before surgery using the following schedule:     Please keep in mind the following:  DO NOT shave, including legs and underarms, starting the day of your first shower.   You may shave your face at any point before/day of surgery.  Place clean sheets on your bed the day you start using CHG soap. Use a clean washcloth (not used since being washed) for each shower. DO NOT sleep with pets once you start using the CHG.   CHG Shower Instructions:  Wash your  face and private area with normal soap. If you choose to wash your hair, wash first with your normal shampoo.  After you use shampoo/soap, rinse your hair and body thoroughly to remove shampoo/soap residue.  Turn the water OFF and apply about 3 tablespoons (45 ml) of CHG soap to a CLEAN washcloth.  Apply CHG soap ONLY FROM YOUR NECK DOWN TO YOUR TOES (washing for 3-5 minutes)  DO NOT use CHG soap on face, private areas, open wounds, or sores.  Pay special attention to the area where your surgery is being performed.  If you are having back surgery, having someone wash your back for you may be helpful. Wait 2 minutes after CHG soap is applied, then you may rinse off the CHG soap.  Pat dry with a clean towel  Put on clean clothes/pajamas   If you choose to wear lotion, please use ONLY the CHG-compatible lotions on the back of this paper.   Additional instructions for the day of surgery: DO NOT APPLY any lotions, deodorants, cologne, or perfumes.   Do not bring valuables to the hospital. [facility] is not responsible for any belongings/valuables. Do not wear nail polish, gel polish, artificial nails, or any other type of covering on natural nails (fingers and toes) Do not wear jewelry or makeup Put on clean/comfortable clothes.  Please brush your teeth.  Ask your nurse before applying any prescription medications to the skin.     CHG Compatible Lotions   Aveeno Moisturizing lotion  Cetaphil Moisturizing Cream  Cetaphil Moisturizing Lotion  Clairol Herbal Essence Moisturizing Lotion, Dry Skin  Clairol Herbal Essence Moisturizing Lotion, Extra Dry Skin  Clairol Herbal Essence Moisturizing Lotion, Normal Skin  Curel Age Defying Therapeutic Moisturizing Lotion with Alpha Hydroxy  Curel Extreme Care Body Lotion  Curel Soothing Hands Moisturizing Hand Lotion  Curel Therapeutic Moisturizing Cream, Fragrance-Free  Curel Therapeutic Moisturizing Lotion, Fragrance-Free  Curel Therapeutic  Moisturizing Lotion, Original Formula  Eucerin Daily Replenishing Lotion  Eucerin Dry Skin Therapy Plus Alpha Hydroxy Crme  Eucerin Dry Skin Therapy Plus Alpha Hydroxy Lotion  Eucerin Original Crme  Eucerin Original Lotion  Eucerin Plus Crme Eucerin Plus Lotion  Eucerin TriLipid Replenishing Lotion  Keri Anti-Bacterial Hand Lotion  Keri Deep Conditioning Original Lotion Dry Skin Formula Softly Scented  Keri Deep Conditioning Original Lotion, Fragrance Free Sensitive Skin Formula  Keri Lotion Fast Absorbing Fragrance Free Sensitive Skin Formula  Keri Lotion Fast Absorbing Softly Scented Dry Skin Formula  Keri Original Lotion  Keri Skin Renewal Lotion Keri Silky Smooth Lotion  Keri Silky Smooth Sensitive Skin Lotion  Nivea Body Creamy Conditioning Oil  Nivea Body Extra Enriched Teacher, adult education Moisturizing Lotion Nivea Crme  Nivea Skin Firming Lotion  NutraDerm 30 Skin Lotion  NutraDerm Skin Lotion  NutraDerm Therapeutic Skin Cream  NutraDerm Therapeutic Skin Lotion  ProShield Protective Hand Cream  Provon moisturizing lotion  Please read over the following fact sheets that you were given.

## 2022-10-31 ENCOUNTER — Other Ambulatory Visit: Payer: Self-pay

## 2022-10-31 ENCOUNTER — Encounter (HOSPITAL_COMMUNITY)
Admission: RE | Admit: 2022-10-31 | Discharge: 2022-10-31 | Disposition: A | Discharge status: Home / Self Care | Payer: 59 | Source: Ambulatory Visit | Attending: Orthopedic Surgery | Admitting: Orthopedic Surgery

## 2022-10-31 ENCOUNTER — Encounter (HOSPITAL_COMMUNITY): Payer: Self-pay

## 2022-10-31 VITALS — BP 147/89 | HR 65 | Temp 98.2°F | Resp 17 | Ht 70.0 in | Wt 160.7 lb

## 2022-10-31 DIAGNOSIS — Z01812 Encounter for preprocedural laboratory examination: Secondary | ICD-10-CM | POA: Diagnosis not present

## 2022-10-31 DIAGNOSIS — Z01818 Encounter for other preprocedural examination: Secondary | ICD-10-CM

## 2022-10-31 LAB — CBC
HCT: 40 % (ref 39.0–52.0)
Hemoglobin: 12.7 g/dL — ABNORMAL LOW (ref 13.0–17.0)
MCH: 31.2 pg (ref 26.0–34.0)
MCHC: 31.8 g/dL (ref 30.0–36.0)
MCV: 98.3 fL (ref 80.0–100.0)
Platelets: 238 10*3/uL (ref 150–400)
RBC: 4.07 MIL/uL — ABNORMAL LOW (ref 4.22–5.81)
RDW: 11.9 % (ref 11.5–15.5)
WBC: 4.6 10*3/uL (ref 4.0–10.5)
nRBC: 0 % (ref 0.0–0.2)

## 2022-10-31 LAB — SURGICAL PCR SCREEN
MRSA, PCR: NEGATIVE
Staphylococcus aureus: POSITIVE — AB

## 2022-10-31 NOTE — Progress Notes (Signed)
PCP -  Dr. Irena Reichmann Cardiologist -   PPM/ICD - denies Device Orders - n/a Rep Notified - n/a  Chest x-ray - n/a EKG - na Stress Test -  ECHO -  Cardiac Cath -   Sleep Study - denies CPAP - denies  Non-diabetic  Blood Thinner Instructions: denies Aspirin Instructions: denies  ERAS Protcol - Yes, clear fluids until 0935  COVID TEST- n/a   Anesthesia review: No  Patient denies shortness of breath, fever, cough and chest pain at PAT appointment   All instructions explained to the patient, with a verbal understanding of the material. Patient agrees to go over the instructions while at home for a better understanding. Patient also instructed to self quarantine after being tested for COVID-19. The opportunity to ask questions was provided.

## 2022-11-01 ENCOUNTER — Other Ambulatory Visit (HOSPITAL_COMMUNITY): Payer: Self-pay

## 2022-11-01 MED ORDER — MUPIROCIN 2 % EX OINT
TOPICAL_OINTMENT | CUTANEOUS | 0 refills | Status: DC
  Filled 2022-11-01: qty 22, 7d supply, fill #0

## 2022-11-02 ENCOUNTER — Other Ambulatory Visit (HOSPITAL_COMMUNITY): Payer: Self-pay

## 2022-11-02 DIAGNOSIS — M5416 Radiculopathy, lumbar region: Secondary | ICD-10-CM | POA: Diagnosis not present

## 2022-11-02 NOTE — Anesthesia Preprocedure Evaluation (Addendum)
Anesthesia Evaluation  Patient identified by MRN, date of birth, ID band Patient awake    Reviewed: Allergy & Precautions, H&P , NPO status , Patient's Chart, lab work & pertinent test results  Airway Mallampati: II  TM Distance: >3 FB Neck ROM: Full    Dental no notable dental hx. (+) Teeth Intact, Dental Advisory Given, Poor Dentition, Missing,    Pulmonary neg pulmonary ROS, COPD, former smoker   Pulmonary exam normal breath sounds clear to auscultation       Cardiovascular Exercise Tolerance: Good negative cardio ROS Normal cardiovascular exam Rhythm:Regular Rate:Normal     Neuro/Psych negative neurological ROS  negative psych ROS   GI/Hepatic negative GI ROS, Neg liver ROS,,,  Endo/Other  negative endocrine ROS    Renal/GU negative Renal ROS  negative genitourinary   Musculoskeletal negative musculoskeletal ROS (+) Arthritis , Osteoarthritis,    Abdominal   Peds negative pediatric ROS (+)  Hematology negative hematology ROS (+)   Anesthesia Other Findings   Reproductive/Obstetrics negative OB ROS                             Anesthesia Physical Anesthesia Plan  ASA: 3  Anesthesia Plan: General   Post-op Pain Management: Tylenol PO (pre-op)*, Celebrex PO (pre-op)*, Gabapentin PO (pre-op)* and Dilaudid IV   Induction: Intravenous  PONV Risk Score and Plan: 2 and Ondansetron, Dexamethasone and Treatment may vary due to age or medical condition  Airway Management Planned: Oral ETT  Additional Equipment: None  Intra-op Plan:   Post-operative Plan:   Informed Consent: I have reviewed the patients History and Physical, chart, labs and discussed the procedure including the risks, benefits and alternatives for the proposed anesthesia with the patient or authorized representative who has indicated his/her understanding and acceptance.       Plan Discussed with:  Anesthesiologist and CRNA  Anesthesia Plan Comments:        Anesthesia Quick Evaluation

## 2022-11-03 ENCOUNTER — Other Ambulatory Visit (HOSPITAL_COMMUNITY): Payer: Self-pay

## 2022-11-05 ENCOUNTER — Encounter (HOSPITAL_COMMUNITY)
Admission: RE | Disposition: A | Discharge status: Home / Self Care | Payer: Self-pay | Source: Home / Self Care | Attending: Orthopedic Surgery

## 2022-11-05 ENCOUNTER — Ambulatory Visit (HOSPITAL_COMMUNITY)
Admission: RE | Admit: 2022-11-05 | Discharge: 2022-11-06 | Disposition: A | Discharge status: Home / Self Care | Payer: 59 | Attending: Orthopedic Surgery | Admitting: Orthopedic Surgery

## 2022-11-05 ENCOUNTER — Other Ambulatory Visit: Payer: Self-pay

## 2022-11-05 ENCOUNTER — Ambulatory Visit (HOSPITAL_COMMUNITY): Payer: 59

## 2022-11-05 ENCOUNTER — Ambulatory Visit (HOSPITAL_COMMUNITY): Payer: 59 | Admitting: Anesthesiology

## 2022-11-05 ENCOUNTER — Encounter (HOSPITAL_COMMUNITY): Payer: Self-pay | Admitting: Orthopedic Surgery

## 2022-11-05 ENCOUNTER — Ambulatory Visit (HOSPITAL_COMMUNITY): Payer: Self-pay | Admitting: Anesthesiology

## 2022-11-05 DIAGNOSIS — M5116 Intervertebral disc disorders with radiculopathy, lumbar region: Secondary | ICD-10-CM | POA: Insufficient documentation

## 2022-11-05 DIAGNOSIS — Z0189 Encounter for other specified special examinations: Secondary | ICD-10-CM | POA: Diagnosis not present

## 2022-11-05 DIAGNOSIS — M1990 Unspecified osteoarthritis, unspecified site: Secondary | ICD-10-CM | POA: Diagnosis not present

## 2022-11-05 DIAGNOSIS — J449 Chronic obstructive pulmonary disease, unspecified: Secondary | ICD-10-CM | POA: Diagnosis not present

## 2022-11-05 DIAGNOSIS — Z87891 Personal history of nicotine dependence: Secondary | ICD-10-CM | POA: Diagnosis not present

## 2022-11-05 DIAGNOSIS — M5126 Other intervertebral disc displacement, lumbar region: Secondary | ICD-10-CM | POA: Diagnosis present

## 2022-11-05 DIAGNOSIS — M5136 Other intervertebral disc degeneration, lumbar region: Secondary | ICD-10-CM | POA: Diagnosis not present

## 2022-11-05 HISTORY — PX: LUMBAR LAMINECTOMY/DECOMPRESSION MICRODISCECTOMY: SHX5026

## 2022-11-05 SURGERY — LUMBAR LAMINECTOMY/DECOMPRESSION MICRODISCECTOMY 1 LEVEL
Anesthesia: General | Site: Spine Lumbar | Laterality: Right

## 2022-11-05 MED ORDER — ORAL CARE MOUTH RINSE: 15.0000 mL | Freq: Once | OROMUCOSAL | Status: AC

## 2022-11-05 MED ORDER — DEXAMETHASONE 4 MG PO TABS
4.0000 mg | ORAL_TABLET | Freq: Four times a day (QID) | ORAL | Status: DC
  Administered 2022-11-05 – 2022-11-06 (×2): 4 mg via ORAL
  Filled 2022-11-05 (×2): qty 1

## 2022-11-05 MED ORDER — MEPERIDINE HCL 25 MG/ML IJ SOLN: 6.2500 mg | INTRAMUSCULAR | Status: DC | PRN

## 2022-11-05 MED ORDER — SODIUM CHLORIDE 0.9 % IV SOLN: 250.0000 mL | INTRAVENOUS | Status: DC

## 2022-11-05 MED ORDER — 0.9 % SODIUM CHLORIDE (POUR BTL) OPTIME
TOPICAL | Status: DC | PRN
  Administered 2022-11-05: 1000 mL

## 2022-11-05 MED ORDER — CHLORHEXIDINE GLUCONATE 0.12 % MT SOLN
15.0000 mL | Freq: Once | OROMUCOSAL | Status: AC
  Administered 2022-11-05: 15 mL via OROMUCOSAL
  Filled 2022-11-05: qty 15

## 2022-11-05 MED ORDER — ONDANSETRON HCL 4 MG/2ML IJ SOLN
INTRAMUSCULAR | Status: DC | PRN
  Administered 2022-11-05: 4 mg via INTRAVENOUS

## 2022-11-05 MED ORDER — MENTHOL 3 MG MT LOZG
1.0000 | LOZENGE | OROMUCOSAL | Status: DC | PRN
  Administered 2022-11-06: 3 mg via ORAL
  Filled 2022-11-05: qty 9

## 2022-11-05 MED ORDER — SURGIFLO WITH THROMBIN (HEMOSTATIC MATRIX KIT) OPTIME
TOPICAL | Status: DC | PRN
Start: 2022-11-05 — End: 2022-11-05
  Administered 2022-11-05: 1 via TOPICAL

## 2022-11-05 MED ORDER — MIDAZOLAM HCL 2 MG/2ML IJ SOLN
INTRAMUSCULAR | Status: AC
  Filled 2022-11-05: qty 2

## 2022-11-05 MED ORDER — OXYCODONE HCL 5 MG PO TABS: 10.0000 mg | ORAL_TABLET | ORAL | Status: DC | PRN

## 2022-11-05 MED ORDER — TRANEXAMIC ACID-NACL 1000-0.7 MG/100ML-% IV SOLN
1000.0000 mg | INTRAVENOUS | Status: AC
  Administered 2022-11-05: 1000 mg via INTRAVENOUS
  Filled 2022-11-05: qty 100

## 2022-11-05 MED ORDER — FENTANYL CITRATE (PF) 100 MCG/2ML IJ SOLN: 25.0000 ug | INTRAMUSCULAR | Status: DC | PRN

## 2022-11-05 MED ORDER — CEFAZOLIN SODIUM-DEXTROSE 1-4 GM/50ML-% IV SOLN
1.0000 g | Freq: Three times a day (TID) | INTRAVENOUS | Status: AC
  Administered 2022-11-05 – 2022-11-06 (×2): 1 g via INTRAVENOUS
  Filled 2022-11-05 (×2): qty 50

## 2022-11-05 MED ORDER — THROMBIN 20000 UNITS EX SOLR
CUTANEOUS | Status: AC
  Filled 2022-11-05: qty 20000

## 2022-11-05 MED ORDER — CEFAZOLIN SODIUM-DEXTROSE 2-4 GM/100ML-% IV SOLN
2.0000 g | INTRAVENOUS | Status: AC
  Administered 2022-11-05: 2 g via INTRAVENOUS
  Filled 2022-11-05: qty 100

## 2022-11-05 MED ORDER — METHOCARBAMOL 500 MG PO TABS
500.0000 mg | ORAL_TABLET | Freq: Four times a day (QID) | ORAL | Status: DC | PRN
  Administered 2022-11-05 – 2022-11-06 (×2): 500 mg via ORAL
  Filled 2022-11-05 (×2): qty 1

## 2022-11-05 MED ORDER — ACETAMINOPHEN 160 MG/5ML PO SOLN: 325.0000 mg | ORAL | Status: DC | PRN

## 2022-11-05 MED ORDER — SODIUM CHLORIDE 0.9% FLUSH: 3.0000 mL | INTRAVENOUS | Status: DC | PRN

## 2022-11-05 MED ORDER — MIDAZOLAM HCL 2 MG/2ML IJ SOLN
INTRAMUSCULAR | Status: DC | PRN
  Administered 2022-11-05: 2 mg via INTRAVENOUS

## 2022-11-05 MED ORDER — GABAPENTIN 300 MG PO CAPS
300.0000 mg | ORAL_CAPSULE | Freq: Three times a day (TID) | ORAL | Status: DC
  Administered 2022-11-05 (×2): 300 mg via ORAL
  Filled 2022-11-05 (×2): qty 1

## 2022-11-05 MED ORDER — ACETAMINOPHEN 325 MG PO TABS: 650.0000 mg | ORAL_TABLET | ORAL | Status: DC | PRN

## 2022-11-05 MED ORDER — LACTATED RINGERS IV SOLN: INTRAVENOUS | Status: DC

## 2022-11-05 MED ORDER — SENNOSIDES-DOCUSATE SODIUM 8.6-50 MG PO TABS
1.0000 | ORAL_TABLET | Freq: Two times a day (BID) | ORAL | Status: DC
  Administered 2022-11-05: 1 via ORAL
  Filled 2022-11-05: qty 1

## 2022-11-05 MED ORDER — ACETAMINOPHEN 500 MG PO TABS
1000.0000 mg | ORAL_TABLET | Freq: Once | ORAL | Status: AC
  Administered 2022-11-05: 1000 mg via ORAL
  Filled 2022-11-05: qty 2

## 2022-11-05 MED ORDER — DEXAMETHASONE SODIUM PHOSPHATE 4 MG/ML IJ SOLN
4.0000 mg | Freq: Four times a day (QID) | INTRAMUSCULAR | Status: DC
  Administered 2022-11-05: 4 mg via INTRAVENOUS
  Filled 2022-11-05: qty 1

## 2022-11-05 MED ORDER — POLYETHYLENE GLYCOL 3350 17 G PO PACK
17.0000 g | PACK | Freq: Every day | ORAL | Status: DC | PRN
  Administered 2022-11-05: 17 g via ORAL
  Filled 2022-11-05: qty 1

## 2022-11-05 MED ORDER — FENTANYL CITRATE (PF) 250 MCG/5ML IJ SOLN
INTRAMUSCULAR | Status: DC | PRN
  Administered 2022-11-05: 150 ug via INTRAVENOUS
  Administered 2022-11-05: 100 ug via INTRAVENOUS

## 2022-11-05 MED ORDER — MAGNESIUM CITRATE PO SOLN: 1.0000 | Freq: Once | ORAL | Status: DC | PRN

## 2022-11-05 MED ORDER — HYDRALAZINE HCL 20 MG/ML IJ SOLN
INTRAMUSCULAR | Status: AC
  Filled 2022-11-05: qty 1

## 2022-11-05 MED ORDER — OXYCODONE HCL 5 MG/5ML PO SOLN: 5.0000 mg | Freq: Once | ORAL | Status: DC | PRN

## 2022-11-05 MED ORDER — SODIUM CHLORIDE 0.9% FLUSH: 3.0000 mL | Freq: Two times a day (BID) | INTRAVENOUS | Status: DC

## 2022-11-05 MED ORDER — DEXAMETHASONE SODIUM PHOSPHATE 10 MG/ML IJ SOLN
INTRAMUSCULAR | Status: DC | PRN
  Administered 2022-11-05: 10 mg via INTRAVENOUS

## 2022-11-05 MED ORDER — OXYCODONE HCL 5 MG PO TABS: 5.0000 mg | ORAL_TABLET | Freq: Once | ORAL | Status: DC | PRN

## 2022-11-05 MED ORDER — OXYCODONE HCL 5 MG PO TABS
5.0000 mg | ORAL_TABLET | ORAL | Status: DC | PRN
  Administered 2022-11-05 (×2): 5 mg via ORAL
  Filled 2022-11-05 (×3): qty 1

## 2022-11-05 MED ORDER — PHENYLEPHRINE 80 MCG/ML (10ML) SYRINGE FOR IV PUSH (FOR BLOOD PRESSURE SUPPORT)
PREFILLED_SYRINGE | INTRAVENOUS | Status: DC | PRN
  Administered 2022-11-05: 80 ug via INTRAVENOUS

## 2022-11-05 MED ORDER — PROPOFOL 10 MG/ML IV BOLUS
INTRAVENOUS | Status: DC | PRN
  Administered 2022-11-05: 150 mg via INTRAVENOUS

## 2022-11-05 MED ORDER — SUCCINYLCHOLINE CHLORIDE 200 MG/10ML IV SOSY
PREFILLED_SYRINGE | INTRAVENOUS | Status: DC | PRN
  Administered 2022-11-05: 180 mg via INTRAVENOUS

## 2022-11-05 MED ORDER — OXYCODONE-ACETAMINOPHEN 10-325 MG PO TABS: 1.0000 | ORAL_TABLET | Freq: Four times a day (QID) | ORAL | 0 refills | Status: AC | PRN

## 2022-11-05 MED ORDER — METHOCARBAMOL 500 MG PO TABS: 500.0000 mg | ORAL_TABLET | Freq: Three times a day (TID) | ORAL | 0 refills | Status: AC | PRN

## 2022-11-05 MED ORDER — FENTANYL CITRATE (PF) 250 MCG/5ML IJ SOLN
INTRAMUSCULAR | Status: AC
  Filled 2022-11-05: qty 5

## 2022-11-05 MED ORDER — HYDRALAZINE HCL 20 MG/ML IJ SOLN
10.0000 mg | Freq: Once | INTRAMUSCULAR | Status: AC
  Administered 2022-11-05: 10 mg via INTRAVENOUS

## 2022-11-05 MED ORDER — ONDANSETRON HCL 4 MG/2ML IJ SOLN: 4.0000 mg | Freq: Four times a day (QID) | INTRAMUSCULAR | Status: DC | PRN

## 2022-11-05 MED ORDER — ACETAMINOPHEN 650 MG RE SUPP: 650.0000 mg | RECTAL | Status: DC | PRN

## 2022-11-05 MED ORDER — ONDANSETRON HCL 4 MG PO TABS: 4.0000 mg | ORAL_TABLET | Freq: Four times a day (QID) | ORAL | Status: DC | PRN

## 2022-11-05 MED ORDER — GABAPENTIN 300 MG PO CAPS
300.0000 mg | ORAL_CAPSULE | ORAL | Status: AC
  Administered 2022-11-05: 300 mg via ORAL
  Filled 2022-11-05: qty 1

## 2022-11-05 MED ORDER — PHENOL 1.4 % MT LIQD: 1.0000 | OROMUCOSAL | Status: DC | PRN

## 2022-11-05 MED ORDER — BUPIVACAINE-EPINEPHRINE (PF) 0.25% -1:200000 IJ SOLN
INTRAMUSCULAR | Status: AC
  Filled 2022-11-05: qty 30

## 2022-11-05 MED ORDER — CELECOXIB 200 MG PO CAPS
200.0000 mg | ORAL_CAPSULE | Freq: Once | ORAL | Status: AC
  Administered 2022-11-05: 200 mg via ORAL
  Filled 2022-11-05: qty 1

## 2022-11-05 MED ORDER — ACETAMINOPHEN 325 MG PO TABS: 325.0000 mg | ORAL_TABLET | ORAL | Status: DC | PRN

## 2022-11-05 MED ORDER — METHOCARBAMOL 1000 MG/10ML IJ SOLN: 500.0000 mg | Freq: Four times a day (QID) | INTRAVENOUS | Status: DC | PRN

## 2022-11-05 MED ORDER — FENTANYL CITRATE (PF) 250 MCG/5ML IJ SOLN: INTRAMUSCULAR | Status: DC | PRN

## 2022-11-05 MED ORDER — ONDANSETRON HCL 4 MG/2ML IJ SOLN: 4.0000 mg | Freq: Once | INTRAMUSCULAR | Status: DC | PRN

## 2022-11-05 MED ORDER — ONDANSETRON HCL 4 MG PO TABS: 4.0000 mg | ORAL_TABLET | Freq: Three times a day (TID) | ORAL | 0 refills | Status: AC | PRN

## 2022-11-05 MED ORDER — THROMBIN 20000 UNITS EX SOLR
CUTANEOUS | Status: DC | PRN
  Administered 2022-11-05: 20 mL via TOPICAL

## 2022-11-05 MED ORDER — HYDROMORPHONE HCL 1 MG/ML IJ SOLN: 1.0000 mg | INTRAMUSCULAR | Status: DC | PRN

## 2022-11-05 MED ORDER — PHENYLEPHRINE HCL-NACL 20-0.9 MG/250ML-% IV SOLN
INTRAVENOUS | Status: DC | PRN
Start: 2022-11-05 — End: 2022-11-05
  Administered 2022-11-05: 50 ug/min via INTRAVENOUS

## 2022-11-05 MED ORDER — BUPIVACAINE-EPINEPHRINE 0.25% -1:200000 IJ SOLN
INTRAMUSCULAR | Status: DC | PRN
  Administered 2022-11-05: 10 mL

## 2022-11-05 SURGICAL SUPPLY — 56 items
AGENT HMST KT MTR STRL THRMB (HEMOSTASIS) ×1
BAG COUNTER SPONGE SURGICOUNT (BAG) ×2 IMPLANT
BAG SPNG CNTER NS LX DISP (BAG) ×1
BNDG GAUZE DERMACEA FLUFF 4 (GAUZE/BANDAGES/DRESSINGS) ×2 IMPLANT
BNDG GZE DERMACEA 4 6PLY (GAUZE/BANDAGES/DRESSINGS) ×1
CANISTER SUCT 3000ML PPV (MISCELLANEOUS) ×2 IMPLANT
CLSR STERI-STRIP ANTIMIC 1/2X4 (GAUZE/BANDAGES/DRESSINGS) ×2 IMPLANT
CORD BIPOLAR FORCEPS 12FT (ELECTRODE) ×2 IMPLANT
COVER SURGICAL LIGHT HANDLE (MISCELLANEOUS) ×2 IMPLANT
DRAIN CHANNEL 15F RND FF W/TCR (WOUND CARE) IMPLANT
DRAPE SURG 17X23 STRL (DRAPES) ×2 IMPLANT
DRAPE U-SHAPE 47X51 STRL (DRAPES) ×2 IMPLANT
DRSG OPSITE POSTOP 3X4 (GAUZE/BANDAGES/DRESSINGS) ×2 IMPLANT
DRSG OPSITE POSTOP 4X8 (GAUZE/BANDAGES/DRESSINGS) IMPLANT
DURAPREP 26ML APPLICATOR (WOUND CARE) ×2 IMPLANT
ELECT BLADE 4.0 EZ CLEAN MEGAD (MISCELLANEOUS)
ELECT CAUTERY BLADE 6.4 (BLADE) ×2 IMPLANT
ELECT PENCIL ROCKER SW 15FT (MISCELLANEOUS) ×2 IMPLANT
ELECT REM PT RETURN 9FT ADLT (ELECTROSURGICAL) ×1
ELECTRODE BLDE 4.0 EZ CLN MEGD (MISCELLANEOUS) IMPLANT
ELECTRODE REM PT RTRN 9FT ADLT (ELECTROSURGICAL) ×2 IMPLANT
EVACUATOR SILICONE 100CC (DRAIN) IMPLANT
GLOVE BIO SURGEON STRL SZ 6.5 (GLOVE) ×2 IMPLANT
GLOVE BIOGEL PI IND STRL 6.5 (GLOVE) ×2 IMPLANT
GLOVE BIOGEL PI IND STRL 8.5 (GLOVE) ×2 IMPLANT
GLOVE SS BIOGEL STRL SZ 8.5 (GLOVE) ×2 IMPLANT
GOWN STRL REUS W/ TWL LRG LVL3 (GOWN DISPOSABLE) ×4 IMPLANT
GOWN STRL REUS W/TWL 2XL LVL3 (GOWN DISPOSABLE) ×2 IMPLANT
GOWN STRL REUS W/TWL LRG LVL3 (GOWN DISPOSABLE) ×2
KIT BASIN OR (CUSTOM PROCEDURE TRAY) ×2 IMPLANT
KIT TURNOVER KIT B (KITS) ×2 IMPLANT
NDL 22X1.5 STRL (OR ONLY) (MISCELLANEOUS) ×2 IMPLANT
NDL SPNL 18GX3.5 QUINCKE PK (NEEDLE) ×4 IMPLANT
NEEDLE 22X1.5 STRL (OR ONLY) (MISCELLANEOUS) ×1 IMPLANT
NEEDLE SPNL 18GX3.5 QUINCKE PK (NEEDLE) ×2 IMPLANT
NS IRRIG 1000ML POUR BTL (IV SOLUTION) ×2 IMPLANT
PACK LAMINECTOMY ORTHO (CUSTOM PROCEDURE TRAY) ×2 IMPLANT
PACK UNIVERSAL I (CUSTOM PROCEDURE TRAY) ×2 IMPLANT
PAD ARMBOARD 7.5X6 YLW CONV (MISCELLANEOUS) ×4 IMPLANT
PATTIES SURGICAL .5 X.5 (GAUZE/BANDAGES/DRESSINGS) ×2 IMPLANT
PATTIES SURGICAL .5 X1 (DISPOSABLE) ×2 IMPLANT
SPONGE SURGIFOAM ABS GEL 100 (HEMOSTASIS) IMPLANT
SPONGE T-LAP 4X18 ~~LOC~~+RFID (SPONGE) ×6 IMPLANT
SURGIFLO W/THROMBIN 8M KIT (HEMOSTASIS) IMPLANT
SUT BONE WAX W31G (SUTURE) ×2 IMPLANT
SUT MNCRL AB 3-0 PS2 27 (SUTURE) IMPLANT
SUT MNCRL+ AB 3-0 CT1 36 (SUTURE) ×2 IMPLANT
SUT VIC AB 1 CT1 18XCR BRD 8 (SUTURE) ×2 IMPLANT
SUT VIC AB 1 CT1 8-18 (SUTURE) ×2
SUT VIC AB 2-0 CT1 18 (SUTURE) ×2 IMPLANT
SYR BULB IRRIG 60ML STRL (SYRINGE) ×2 IMPLANT
SYR CONTROL 10ML LL (SYRINGE) ×2 IMPLANT
TOWEL GREEN STERILE (TOWEL DISPOSABLE) ×2 IMPLANT
TOWEL GREEN STERILE FF (TOWEL DISPOSABLE) ×2 IMPLANT
WATER STERILE IRR 1000ML POUR (IV SOLUTION) ×2 IMPLANT
YANKAUER SUCT BULB TIP NO VENT (SUCTIONS) IMPLANT

## 2022-11-05 NOTE — Discharge Instructions (Signed)

## 2022-11-05 NOTE — Transfer of Care (Signed)
Immediate Anesthesia Transfer of Care Note  Patient: Cory Jennings [facility]  Procedure(s) Performed: LUMBAR THREE TO FOUR DECOMPRESSION AND DISCECTOMY (Right: Spine Lumbar)  Patient Location: PACU  Anesthesia Type:General  Level of Consciousness: awake, alert , and oriented  Airway & Oxygen Therapy: Patient Spontanous Breathing and Patient connected to face mask oxygen  Post-op Assessment: Report given to RN  Post vital signs: Reviewed and stable  Last Vitals:  Vitals Value Taken Time  BP 159/109 11/05/22 1449  Temp    Pulse 68 11/05/22 1500  Resp 10 11/05/22 1500  SpO2 98 % 11/05/22 1500  Vitals shown include unfiled device data.  Last Pain:  Vitals:   11/05/22 1030  TempSrc: Oral         Complications: No notable events documented.

## 2022-11-05 NOTE — Anesthesia Postprocedure Evaluation (Signed)
Anesthesia Post Note  Patient: Roosvelt Churchwell [facility]  Procedure(s) Performed: LUMBAR THREE TO FOUR DECOMPRESSION AND DISCECTOMY (Right: Spine Lumbar)     Patient location during evaluation: PACU Anesthesia Type: General Level of consciousness: awake and alert Pain management: pain level controlled Vital Signs Assessment: post-procedure vital signs reviewed and stable Respiratory status: spontaneous breathing, nonlabored ventilation, respiratory function stable and patient connected to nasal cannula oxygen Cardiovascular status: blood pressure returned to baseline and stable Postop Assessment: no apparent nausea or vomiting Anesthetic complications: no  No notable events documented.  Last Vitals:  Vitals:   11/05/22 1450 11/05/22 1500  BP: (!) 159/109 (!) 158/109  Pulse: 78 71  Resp: 12 (!) 9  Temp:    SpO2: 100% 99%    Last Pain:  Vitals:   11/05/22 1030  TempSrc: Oral                 Randi Poullard

## 2022-11-05 NOTE — Brief Op Note (Signed)
11/05/2022  2:21 PM  PATIENT:  Cory Jennings  62 y.o. male  PRE-OPERATIVE DIAGNOSIS:  Right L3-4 disc hernation with radiculopathy  POST-OPERATIVE DIAGNOSIS:  Right L3-4 disc hernation with radiculopathy  PROCEDURE:  Procedure(s): LUMBAR THREE TO FOUR DECOMPRESSION AND DISCECTOMY (Right)  SURGEON:  Surgeons and Role:    Venita Lick, MD - Primary  PHYSICIAN ASSISTANT:   ASSISTANTS: Luther Bradley   ANESTHESIA:   general  EBL:  20 mL   BLOOD ADMINISTERED:none  DRAINS: none   LOCAL MEDICATIONS USED:  MARCAINE     SPECIMEN:  No Specimen  DISPOSITION OF SPECIMEN:  N/A  COUNTS:  YES  TOURNIQUET:  * No tourniquets in log *  DICTATION: .Dragon Dictation  PLAN OF CARE: Admit for overnight observation  PATIENT DISPOSITION:  PACU - hemodynamically stable.

## 2022-11-05 NOTE — Op Note (Signed)
OPERATIVE REPORT  DATE OF SURGERY: 11/05/2022  PATIENT NAME:  Cory Jennings MRN: [id number] DOB: [birth year]  PCP: Irena Reichmann, DO  PRE-OPERATIVE DIAGNOSIS: Right L3-4 disc herniation with radiculopathy  POST-OPERATIVE DIAGNOSIS: Same  PROCEDURE:   Right L3-4 discectomy with medial facetectomy and foraminotomy  SURGEON:  Venita Lick, MD  PHYSICIAN ASSISTANT: Luther Bradley  ANESTHESIA:   General  EBL: 20 ml   Complications: None  BRIEF HISTORY: Cory Jennings is a 62 y.o. male who presented my office with complaints of severe anterior right thigh and medial calf pain.  Patient had multiple levels of degenerative disc disease but had a right L3-4 disc herniation causing L3 and L4 nerve root compression.  As a result of the severity of his pain and loss in quality of life we elected to move forward with a decompression/discectomy.  All appropriate risks, benefits, and alternatives were discussed with the patient and consent was obtained.  PROCEDURE DETAILS: Patient was brought into the operating room and was properly positioned on the operating room table.  After induction with general anesthesia the patient was endotracheally intubated.  A timeout was taken to confirm all important data: including patient, procedure, and the level. Teds, SCD's were applied.   Patient was placed prone onto the Wilson frame and all bony promises well-padded.  The back was then prepped and draped in a standard fashion.  18-gauge needle was placed in the back and x-ray was taken to localize our skin incision.  I marked out my skin incision to span the L3-4 disc space.  The incision site was infiltrated with quarter percent Marcaine with epinephrine.  Midline incision was made and sharp dissection was carried out down to the deep I incised the deep fascia and stripped the paraspinal muscles on the right side to expose the L3 and L4 spinous process, lamina, and the facet complex.  Care was taken not  to violate the facet capsule.  Penfield 4 was placed underneath the L3 lamina and an x-ray was taken confirming that I was at the appropriate level.  Using a 3 mm Kerrison rongeur I performed a laminotomy of L3 and resected the medial third of the facet.  I then dissected with the Penfield 4 through the ligamentum flavum until I could create a plane between the thecal sac and the ligamentum flavum.  I then used a 2 mm Kerrison rongeur to resect the ligamentum flavum and expose the dural sac.  I then began gently dissecting into the lateral recess and there was a large right medial spur causing significant lateral recess and foraminal stenosis.  This was consistent with what was seen on the preoperative MRI.  I was able to ultimately gently mobilize the thecal sac and L4 nerve root medially and use my Kerrison rongeur to resect the osteophyte.  I then waited the lateral recess decompression by resecting the medial portion of the superior L4 facet.  I then noted that the L4 nerve root was still under significant compression and was not easily mobilized.  Using a Penfield 4 I gently dissected and mobilized the L4 nerve root to expose a rather large posterior lateral disc herniation.  Neuro patties were placed superiorly and inferiorly to aid in retraction and then I incised the annulus.  Immediately 3 large fragments of disc material were easily removed using a nerve hook and micropituitary rongeurs.  At this point the L4 nerve root was now much more mobile.  I was able to perform a medial  foraminotomy of L4 and completely decompress the nerve root around the medial side of the pedicle and into the foramen.  Using Epstein curettes I removed some more medial disc material and osteophyte.  At this point I was able to freely pass my nerve hook superiorly and out the L3 foramen inferiorly along the route of the L4 nerve root into the L4 foramen and medially along the undersurface annulus.  There was no residual  compression of either the L3 or L4 nerve root or centrally.  I irrigated the wound copiously normal saline and used bipolar cautery and Floseal to obtain hemostasis.  After final irrigation I did 1 last gentle check with my nerve hook to ensure adequate decompression.  I then placed a thrombin-soaked Gelfoam patty over the laminotomy site.  Retractors were removed and I closed the deep fascia with interrupted #1 Vicryl sutures.  I then closed superficial with interrupted 2-0 Vicryl sutures and finally a 3-0 Monocryl for the skin.  Steri-Strips and dry dressings were applied and the patient was ultimately extubated transferred to PACU without incident the end of the case all needle sponge counts were correct.  Venita Lick, MD 11/05/2022 2:15 PM

## 2022-11-05 NOTE — OR Nursing (Signed)
Surgical level L3-L4 confirmed on X-ray by Dr. Renette Butters, Radiologist.

## 2022-11-05 NOTE — H&P (Signed)
History: Trase continues to have severe back buttock and radicular right leg pain predominantly in the L3 and L4 dermatome. He noted 3 days of near complete relief following the right L3 selective nerve root block. As result of the severe pain and loss of quality of life he would like to move forward with surgery which I think is reasonable.  Past Medical History:  Diagnosis Date   Allergies    Arthritis    COPD (chronic obstructive pulmonary disease) (HCC)     Allergies  Allergen Reactions   Penicillins Swelling    No current facility-administered medications on file prior to encounter.   Current Outpatient Medications on File Prior to Encounter  Medication Sig Dispense Refill   acetaminophen (TYLENOL) 500 MG tablet Take 1,000 mg by mouth every 6 (six) hours as needed.     albuterol (VENTOLIN HFA) 108 (90 Base) MCG/ACT inhaler INHALE ONE PUFF BY MOUTH EVERY 4 HOURS AS NEEDED 18 g 5   cyclobenzaprine (FLEXERIL) 5 MG tablet Take 1-2 tablets (5-10 mg total) by mouth at bedtime as needed. 60 tablet 3   fexofenadine (ALLEGRA ALLERGY) 180 MG tablet Take 1 tablet (180 mg total) by mouth daily for 7 days. (Patient taking differently: Take 180 mg by mouth daily as needed for allergies.) 15 tablet 0   gabapentin (NEURONTIN) 300 MG capsule Take 1 capsule (300 mg total) by mouth 3 (three) times daily as needed for pain. 90 capsule 1   azelastine (ASTELIN) 0.1 % nasal spray Place 1 spray into both nostrils 2 (two) times daily. Use in each nostril as directed (Patient taking differently: Place 1 spray into both nostrils 2 (two) times daily as needed. Use in each nostril as directed) 30 mL 0   cyclobenzaprine (FLEXERIL) 5 MG tablet Take 1 to 2 tablets by mouth once a day at bedtime as needed (Patient not taking: Reported on 10/25/2022) 60 tablet 1   diclofenac (VOLTAREN) 75 MG EC tablet Take 1 tablet (75 mg total) by mouth daily for 7 days for arthritis and then take 1 tablet (75 mg) as needed  (take with food). (Patient not taking: Reported on 10/25/2022) 90 tablet 1   diclofenac (VOLTAREN) 75 MG EC tablet Take 1 tablet (75 mg total) by mouth daily after food for 7 days then use daily as needed for arthritis (Patient not taking: Reported on 10/25/2022) 30 tablet 3   ibuprofen (ADVIL) 800 MG tablet Take 1 tablet (800 mg total) by mouth 4 (four) times daily as needed. (Patient not taking: Reported on 10/25/2022) 12 tablet 0   predniSONE (DELTASONE) 10 MG tablet Take 4 tablets by mouth each morning for 3 days, THEN take 3 tablets each morning for 3 days THEN take 2 tablets  each morning for 3 days THEN take 1 tablet each morning for 5 days. Take in the morning with food. (Patient not taking: Reported on 10/25/2022) 32 tablet 0   predniSONE (DELTASONE) 20 MG tablet Take one tab by mouth twice daily for 4 days, then one daily. Take with food. (Patient not taking: Reported on 10/25/2022) 12 tablet 0   [DISCONTINUED] loratadine (CLARITIN) 10 MG tablet Take 10 mg by mouth daily.      Physical Exam: Clinical exam: Jaece is a pleasant individual, who appears younger than their stated age.  He is alert and orientated 3.  No shortness of breath, chest pain.  Abdomen is soft and non-tender, negative loss of bowel and bladder control, no rebound tenderness.  Negative: skin lesions abrasions contusions  Peripheral pulses: 2+ dorsalis pedis/posterior tibial pulses bilaterally. LE compartments are: Soft and nontender.  Gait pattern: Altered gait pattern due to progressive right radicular leg pain  Assistive devices: None  Neuro: Positive numbness and dysesthesias in the right L3 and L4 dermatome. Negative Babinski test, no clonus, 1+ deep tendon reflexes in the lower extremity. 5/5 motor strength in the lower extremity.  Musculoskeletal: Mild low back discomfort with palpation and range of motion. Significant neuropathic right gluteal and lateral thigh pain.  Imaging: X-rays of the lumbar spine  demonstrate normal sagittal alignment with no scoliosis or spondylolisthesis. Mild degenerative changes at the L3-4 and L4-5 disc spaces. No fractures seen.  Lumbar MRI: completed on 08/27/2022. L3/4: Right posterior lateral osteophyte/disc herniation that extends into the foramen affecting the exiting L3 and traversing L4 nerve root. Broad-based left posterior lateral disc protrusion at L4-5. Small central disc protrusion at L5-S1. No fracture or abnormal bone lesion seen.  A/P: Chrystian is a very pleasant 62 year old gentleman with significant neuropathic right leg pain. Symptoms are consistent with L3 and L4 nerve root compression. Imaging study shows a right L3-4 disc herniation/bone spur causing compression of the exiting L3 and traversing L4 nerve root. The MRI does show disease on the left side at L4-5 clinically he has no symptoms.  Based on the positive results of the injection and the duration of his symptoms and loss and quality of life I think it is reasonable to move forward with surgery. Surgical plan will be lumbar discectomy/decompression on the right side at L3-4. This will alleviate the pressure on the exiting L3 and L4 nerve root and reduce his radicular leg pain. The goal of surgery is to decrease his leg pain and improve his quality of life.  I have gone over the surgical procedure in great detail and the patient has expressed an understanding. Risks and benefits were also reviewed. Risks and benefits of decompression/discectomy: Infection, bleeding, death, stroke, paralysis, ongoing or worse pain, need for additional surgery, leak of spinal fluid, adjacent segment degeneration requiring additional surgery, post-operative hematoma formation that can result in neurological compromise and the need for urgent/emergent re-operation. Loss in bowel and bladder control. Injury to major vessels that could result in the need for urgent abdominal surgery to stop bleeding. Risk of deep venous  thrombosis (DVT) and the need for additional treatment. Recurrent disc herniation resulting in the need for revision surgery, which could include fusion surgery (utilizing instrumentation such as pedicle screws and intervertebral cages).  Additional risk: If instrumentation is used there is a risk of migration, or breakage of that hardware that could require additional surgery.

## 2022-11-06 ENCOUNTER — Other Ambulatory Visit (HOSPITAL_COMMUNITY): Payer: Self-pay

## 2022-11-06 ENCOUNTER — Encounter (HOSPITAL_COMMUNITY): Payer: Self-pay | Admitting: Orthopedic Surgery

## 2022-11-06 DIAGNOSIS — M5116 Intervertebral disc disorders with radiculopathy, lumbar region: Secondary | ICD-10-CM | POA: Diagnosis not present

## 2022-11-06 DIAGNOSIS — Z87891 Personal history of nicotine dependence: Secondary | ICD-10-CM | POA: Diagnosis not present

## 2022-11-06 DIAGNOSIS — M1990 Unspecified osteoarthritis, unspecified site: Secondary | ICD-10-CM | POA: Diagnosis not present

## 2022-11-06 DIAGNOSIS — J449 Chronic obstructive pulmonary disease, unspecified: Secondary | ICD-10-CM | POA: Diagnosis not present

## 2022-11-06 MED ORDER — METHOCARBAMOL 500 MG PO TABS
500.0000 mg | ORAL_TABLET | Freq: Three times a day (TID) | ORAL | 0 refills | Status: AC | PRN
  Filled 2022-11-06: qty 15, 5d supply, fill #0

## 2022-11-06 MED ORDER — OXYCODONE-ACETAMINOPHEN 10-325 MG PO TABS
1.0000 | ORAL_TABLET | Freq: Four times a day (QID) | ORAL | 0 refills | Status: AC | PRN
Start: 2022-11-05 — End: ?
  Filled 2022-11-06: qty 17, 5d supply, fill #0

## 2022-11-06 MED ORDER — ONDANSETRON HCL 4 MG PO TABS
4.0000 mg | ORAL_TABLET | Freq: Three times a day (TID) | ORAL | 0 refills | Status: AC | PRN
  Filled 2022-11-06: qty 20, 7d supply, fill #0

## 2022-11-06 NOTE — Discharge Summary (Signed)
Patient ID: OSHER THU MRN: [id number] DOB/AGE: [birth year] 62 y.o.  Admit date: 11/05/2022 Discharge date: 11/06/2022  Admission Diagnoses:  Principal Problem:   Lumbar disc herniation   Discharge Diagnoses:  Principal Problem:   Lumbar disc herniation  status post Procedure(s): LUMBAR THREE TO FOUR DECOMPRESSION AND DISCECTOMY  Past Medical History:  Diagnosis Date   Allergies    Arthritis    COPD (chronic obstructive pulmonary disease) (HCC)     Surgeries: Procedure(s): LUMBAR THREE TO FOUR DECOMPRESSION AND DISCECTOMY on 11/05/2022   Consultants:   Discharged Condition: Improved  Hospital Course: Cory Jennings is an 62 y.o. male who was admitted 11/05/2022 for operative treatment of Lumbar disc herniation. Patient failed conservative treatments (please see the history and physical for the specifics) and had severe unremitting pain that affects sleep, daily activities and work/hobbies. After pre-op clearance, the patient was taken to the operating room on 11/05/2022 and underwent  Procedure(s): LUMBAR THREE TO FOUR DECOMPRESSION AND DISCECTOMY.    Patient was given perioperative antibiotics:  Anti-infectives (From admission, onward)    Start     Dose/Rate Route Frequency Ordered Stop   11/05/22 2100  ceFAZolin (ANCEF) IVPB 1 g/50 mL premix        1 g 100 mL/hr over 30 Minutes Intravenous Every 8 hours 11/05/22 1603 11/06/22 0317   11/05/22 1027  ceFAZolin (ANCEF) IVPB 2g/100 mL premix        2 g 200 mL/hr over 30 Minutes Intravenous 30 min pre-op 11/05/22 1027 11/05/22 1320        Patient was given sequential compression devices and early ambulation to prevent DVT.   Patient benefited maximally from hospital stay and there were no complications. At the time of discharge, the patient was urinating/moving their bowels without difficulty, tolerating a regular diet, pain is controlled with oral pain medications and they have been cleared by PT/OT.    Recent vital signs: Patient Vitals for the past 24 hrs:  BP Temp Temp src Pulse Resp SpO2 Height Weight  11/06/22 0341 139/78 98 F (36.7 C) Oral 64 20 99 % -- --  11/05/22 2326 (!) 164/92 (!) 97.5 F (36.4 C) Oral 84 20 100 % -- --  11/05/22 1950 (!) 143/71 97.7 F (36.5 C) Oral 82 18 99 % -- --  11/05/22 1615 (!) 166/90 98 F (36.7 C) -- 65 18 100 % -- --  11/05/22 1545 (!) 151/92 97.6 F (36.4 C) -- 67 14 99 % -- --  11/05/22 1530 (!) 149/98 -- -- 65 11 98 % -- --  11/05/22 1522 (!) 170/92 -- -- -- -- -- -- --  11/05/22 1515 (!) 170/92 -- -- 68 13 98 % -- --  11/05/22 1500 (!) 158/109 -- -- 71 (!) 9 99 % -- --  11/05/22 1450 (!) 159/109 97.6 F (36.4 C) -- 78 12 100 % -- --  11/05/22 1056 (!) 154/91 -- -- (!) 54 -- -- -- --  11/05/22 1030 (!) 182/94 97.6 F (36.4 C) Oral 63 17 100 % 5\' 10"  (1.778 m) 73.5 kg     Recent laboratory studies: No results for input(s): "WBC", "HGB", "HCT", "PLT", "NA", "K", "CL", "CO2", "BUN", "CREATININE", "GLUCOSE", "INR", "CALCIUM" in the last 72 hours.  Invalid input(s): "PT", "2"   Discharge Medications:   Allergies as of 11/06/2022       Reactions   Penicillins Swelling        Medication List     STOP taking  these medications    acetaminophen 500 MG tablet Commonly known as: TYLENOL   cyclobenzaprine 5 MG tablet Commonly known as: FLEXERIL   diclofenac 75 MG EC tablet Commonly known as: VOLTAREN   gabapentin 300 MG capsule Commonly known as: NEURONTIN   ibuprofen 800 MG tablet Commonly known as: ADVIL   mupirocin ointment 2 % Commonly known as: BACTROBAN   predniSONE 10 MG tablet Commonly known as: DELTASONE   predniSONE 20 MG tablet Commonly known as: DELTASONE       TAKE these medications    albuterol 108 (90 Base) MCG/ACT inhaler Commonly known as: VENTOLIN HFA INHALE ONE PUFF BY MOUTH EVERY 4 HOURS AS NEEDED   azelastine 0.1 % nasal spray Commonly known as: ASTELIN Place 1 spray into both nostrils  2 (two) times daily. Use in each nostril as directed What changed:  when to take this reasons to take this   fexofenadine 180 MG tablet Commonly known as: Allegra Allergy Take 1 tablet (180 mg total) by mouth daily for 7 days. What changed:  when to take this reasons to take this   methocarbamol 500 MG tablet Commonly known as: ROBAXIN Take 1 tablet (500 mg total) by mouth every 8 (eight) hours as needed for up to 5 days for muscle spasms.   ondansetron 4 MG tablet Commonly known as: Zofran Take 1 tablet (4 mg total) by mouth every 8 (eight) hours as needed for nausea or vomiting.   oxyCODONE-acetaminophen 10-325 MG tablet Commonly known as: Percocet Take 1 tablet by mouth every 6 (six) hours as needed for up to 5 days for pain.        Diagnostic Studies: DG Lumbar Spine 2-3 Views  Result Date: 11/05/2022 CLINICAL DATA:  Provided history: Elective surgery. EXAM: LUMBAR SPINE - 2-3 VIEW COMPARISON:  Lumbar spine MRI 08/19/2022. FINDINGS: Five lumbar vertebrae. The caudal most well-formed intervertebral disc space is designated L5-S1. Spinal numbering remains consistent with that utilized on the prior lumbar spine MRI of 08/19/2022. Two intraoperative lateral view radiographs of the lumbar spine are submitted. On the first image acquired at 12:59 p.m., two surgical instruments project posterior to the lumbar spine, one at the upper L4 vertebral body level and the other at the upper L5 vertebral body level. On the subsequent radiograph acquired at 1:07 p.m., a surgical instrument projects posterior to the lumbar spine at the L3-L4 disc space level. IMPRESSION: Two intraoperative lateral view radiographs of the lumbar spine submitted for the purposes of localization. On the most recent radiograph acquired at 1:07 p.m., a surgical instrument projects posterior the lumbar spine at the L3-L4 disc space level. This was discussed with the operating room staff and Dr. Shon Baton was made aware at  1:25 p.m. on 11/05/2022. Electronically Signed   By: Jackey Loge D.O.   On: 11/05/2022 13:30    Discharge Instructions     Incentive spirometry RT   Complete by: As directed         Follow-up Information     Venita Lick, MD. Schedule an appointment as soon as possible for a visit in 2 week(s).   Specialty: Orthopedic Surgery Why: If symptoms worsen, For suture removal, For wound re-check Contact information: [address] [place] [address] [phone]                 Discharge Plan:  discharge to home   Disposition: Cory Jennings is a very pleasant 62 year old gentleman who had a lumbar discectomy at L3-4.  He notes near complete resolution of his radicular right leg pain.  He has very mild incisional back pain.  Voiding spontaneously, ambulating without assistance, and tolerating regular diet.  Will plan on discharge to home today.  Appropriate instructions and medications have been provided.  He will follow-up with me in 2 weeks for wound check.    Signed: Alvy Beal for Dr. Venita Lick [facility] [phone] 11/06/2022, 7:27 AM

## 2022-11-06 NOTE — Evaluation (Signed)
Occupational Therapy Evaluation Patient Details Name: Cory Jennings MRN: [id number] DOB: [birth year] Today's Date: 11/06/2022   History of Present Illness Cory Jennings is a 62 yo male who underwent right L3-4 discectomy with medial facetectomy and foraminotomy 09/30. PMHx: arthritis, COPD   Clinical Impression   [name] was evaluated s/p the above spine surgery. He is indep and lives with family at baseline. Upon evaluation pt was limited by mild back pain, spinal precautions, impulsivity and decreased activty tolerance. Overall he was bale to mobility and complete all ADLs with generalized superivsion A with cues for compensatory techniques, no AD needed. Provided cues and education on spinal precautions and compensatory techniques throughout, handout provided and pt demonstrated fair recall during ADLs and mobility. Pt does not require further acute OT services. Recommend d/c home with support of family.         If plan is discharge home, recommend the following: Assist for transportation    Functional Status Assessment  Patient has had a recent decline in their functional status and demonstrates the ability to make significant improvements in function in a reasonable and predictable amount of time.  Equipment Recommendations  None recommended by OT       Precautions / Restrictions Precautions Precautions: Fall;Back Precaution Booklet Issued: Yes (comment) Required Braces or Orthoses: Spinal Brace Spinal Brace: Lumbar corset;Applied in sitting position Restrictions Weight Bearing Restrictions: No      Mobility Bed Mobility Overal bed mobility: Needs Assistance Bed Mobility: Rolling, Sidelying to Sit Rolling: Supervision Sidelying to sit: Supervision            Transfers Overall transfer level: Modified independent                        Balance Overall balance assessment: Needs assistance Sitting-balance support: Feet supported Sitting  balance-Leahy Scale: Good     Standing balance support: No upper extremity supported, During functional activity Standing balance-Leahy Scale: Fair             ADL either performed or assessed with clinical judgement   ADL Overall ADL's : Needs assistance/impaired Eating/Feeding: Independent                   General ADL Comments: generalized supervision A for all ADLs, cues and edcation provided for compensatory techniques to maintain back precautions. Pt with good understanding but needed minimal cues at times to maintain while dual tasking     Vision Baseline Vision/History: 0 No visual deficits Vision Assessment?: No apparent visual deficits     Perception Perception: Within Functional Limits       Praxis Praxis: WFL       Pertinent Vitals/Pain Pain Assessment Pain Assessment: Faces Faces Pain Scale: Hurts a little bit Pain Location: back Pain Descriptors / Indicators: Discomfort Pain Intervention(s): Limited activity within patient's tolerance, Monitored during session     Extremity/Trunk Assessment Upper Extremity Assessment Upper Extremity Assessment: Overall WFL for tasks assessed   Lower Extremity Assessment Lower Extremity Assessment: Overall WFL for tasks assessed   Cervical / Trunk Assessment Cervical / Trunk Assessment: Back Surgery   Communication Communication Communication: No apparent difficulties   Cognition Arousal: Alert Behavior During Therapy: WFL for tasks assessed/performed Overall Cognitive Status: Within Functional Limits for tasks assessed                                 General Comments: Some impulsivity  and difficulty maintaining back precuations noted during dual tasking     General Comments  VSS on RA            Home Living Family/patient expects to be discharged to:: Private residence Living Arrangements: Spouse/significant other;Children          Prior Functioning/Environment Prior Level  of Function : Independent/Modified Independent;Driving;Working/employed     Mobility Comments: no AD ADLs Comments: indep        OT Problem List: Decreased range of motion;Decreased strength;Decreased activity tolerance;Decreased knowledge of precautions         OT Goals(Current goals can be found in the care plan section) Acute Rehab OT Goals Patient Stated Goal: home asap OT Goal Formulation: With patient Time For Goal Achievement: 11/20/22 Potential to Achieve Goals: Good   AM-PAC OT "6 Clicks" Daily Activity     Outcome Measure Help from another person eating meals?: None Help from another person taking care of personal grooming?: A Little Help from another person toileting, which includes using toliet, bedpan, or urinal?: A Little Help from another person bathing (including washing, rinsing, drying)?: A Little Help from another person to put on and taking off regular upper body clothing?: A Little Help from another person to put on and taking off regular lower body clothing?: A Little 6 Click Score: 19   End of Session Equipment Utilized During Treatment: Back brace Nurse Communication: Mobility status  Activity Tolerance: Patient tolerated treatment well Patient left: in bed;with call bell/phone within reach  OT Visit Diagnosis: Unsteadiness on feet (R26.81);Other abnormalities of gait and mobility (R26.89);Muscle weakness (generalized) (M62.81)                Time: 9147-8295 OT Time Calculation (min): 20 min Charges:  OT General Charges $OT Visit: 1 Visit OT Evaluation $OT Eval Moderate Complexity: 1 Mod  Derenda Mis, OTR/L Acute Rehabilitation Services Office [phone] Secure Chat Communication Preferred   Donia Pounds 11/06/2022, 10:45 AM

## 2022-11-06 NOTE — Progress Notes (Signed)
PT Cancellation Note and Discharge  Patient Details Name: Cory Jennings MRN: [id number] DOB: Jan 23, 1961   Cancelled Treatment:    Reason Eval/Treat Not Completed: PT screened, no needs identified, will sign off. Discussed pt case with OT who reports pt is currently mobilizing at a modified independent level and does not require a formal PT evaluation at this time. PT signing off. If needs change, please reconsult.     Marylynn Pearson 11/06/2022, 10:03 AM  Conni Slipper, PT, DPT Acute Rehabilitation Services Secure Chat Preferred Office: [phone]

## 2022-11-06 NOTE — Plan of Care (Signed)
Pt doing well. Pt given D/C instructions with verbal understanding. Rx's were given to the Pt at D/C. Pt's incision is clean and dry with on sign of infection. Pt's IV was removed prior to D/C. Pt D/C'd home via wheelchair per MD order. Pt is stable @ D/C and has no other needs at this time. Rema Fendt, RN

## 2022-11-26 IMAGING — DX DG CERVICAL SPINE COMPLETE 4+V
6 series · 6 of 6 positions shown · non-contrast
Comparison: None.

CLINICAL DATA: Left neck pain into left shoulder

EXAM:
CERVICAL SPINE - COMPLETE 4+ VIEW

[c-spine lat]
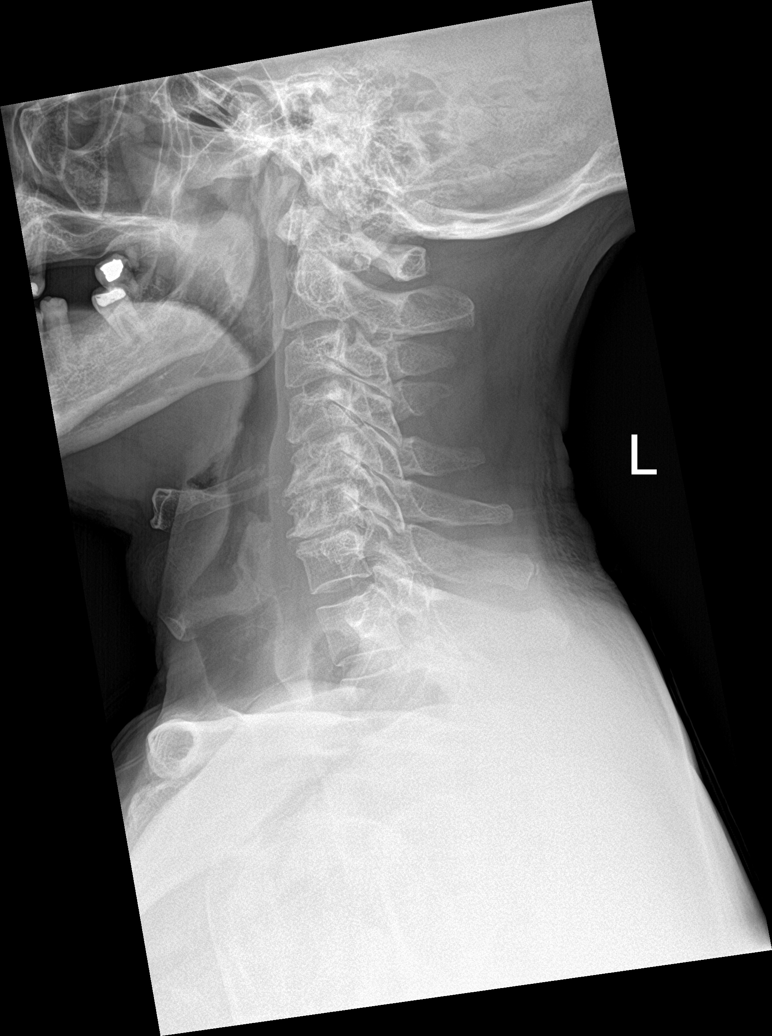

[c-spine obl (1 of 2)]
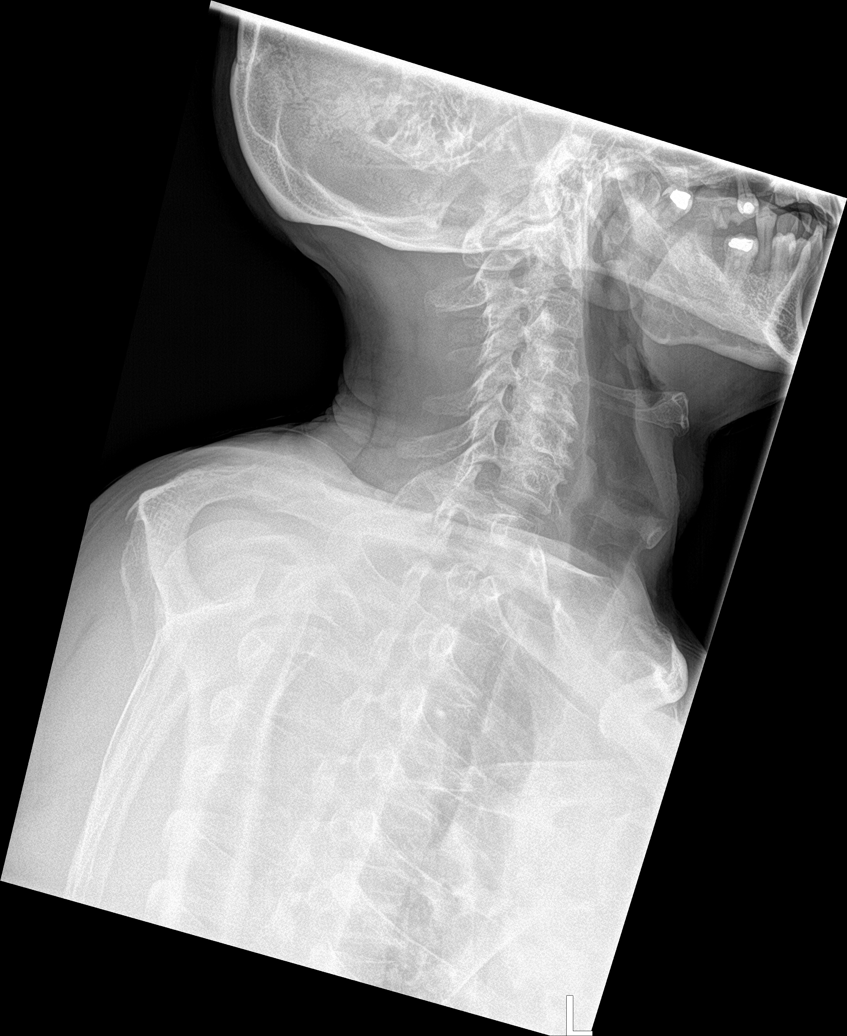

[c-spine obl (2 of 2)]
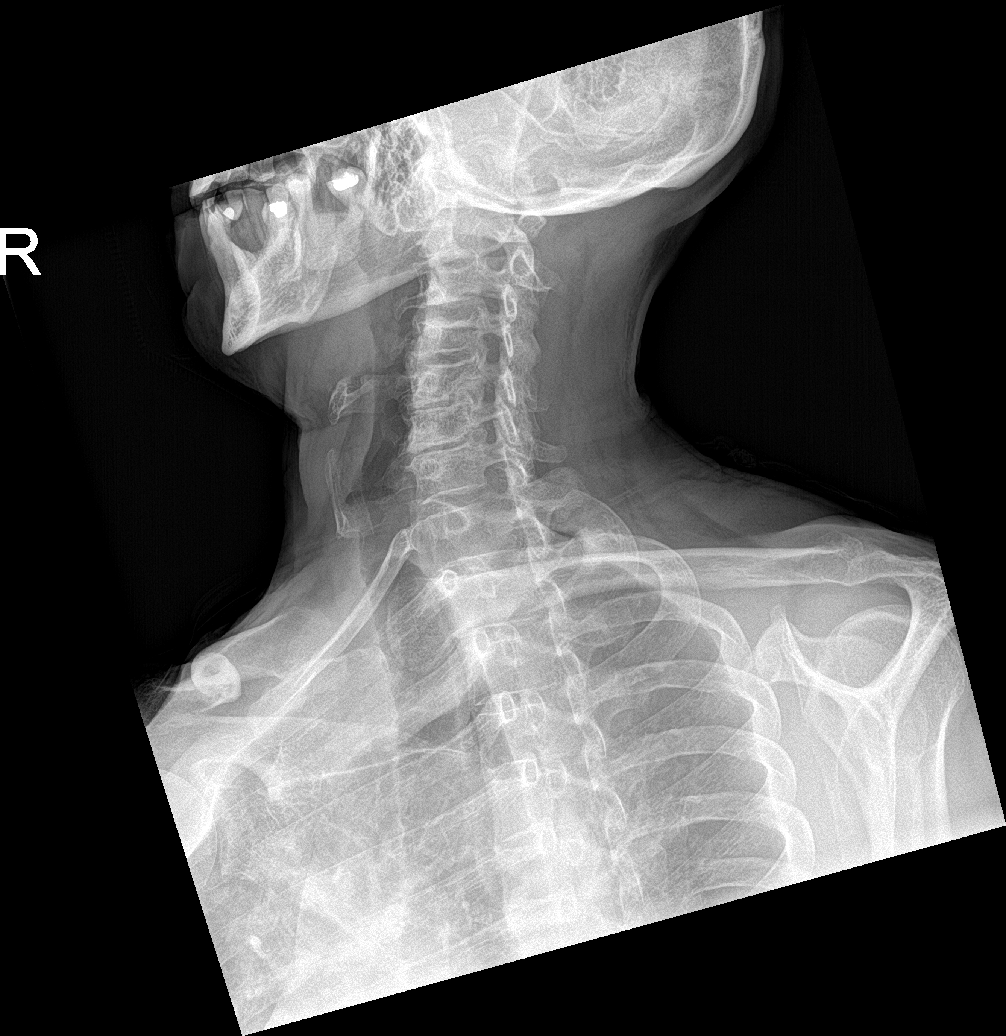

[c-spine ap]
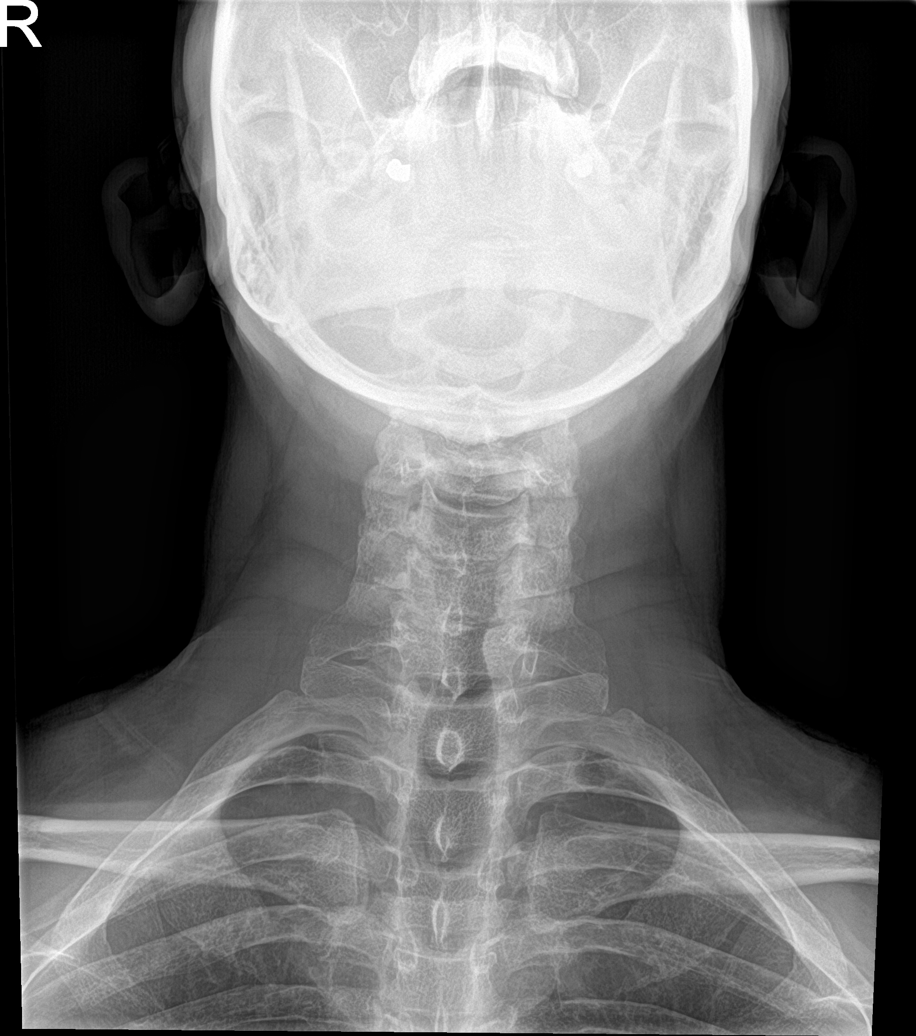

[c-spine open mouth]
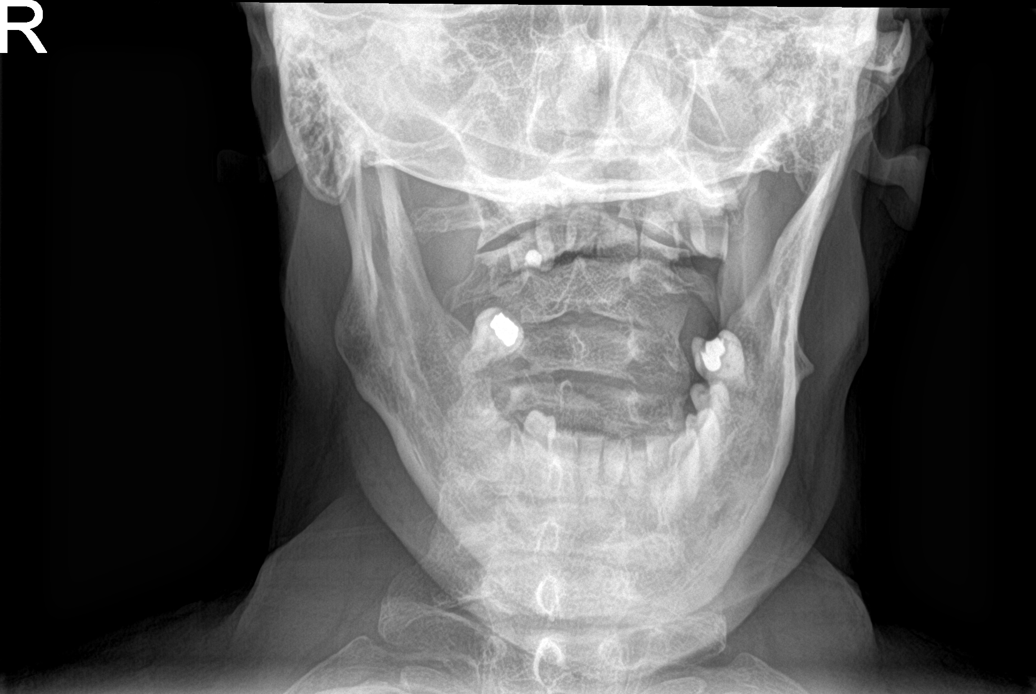

[[person_name]]
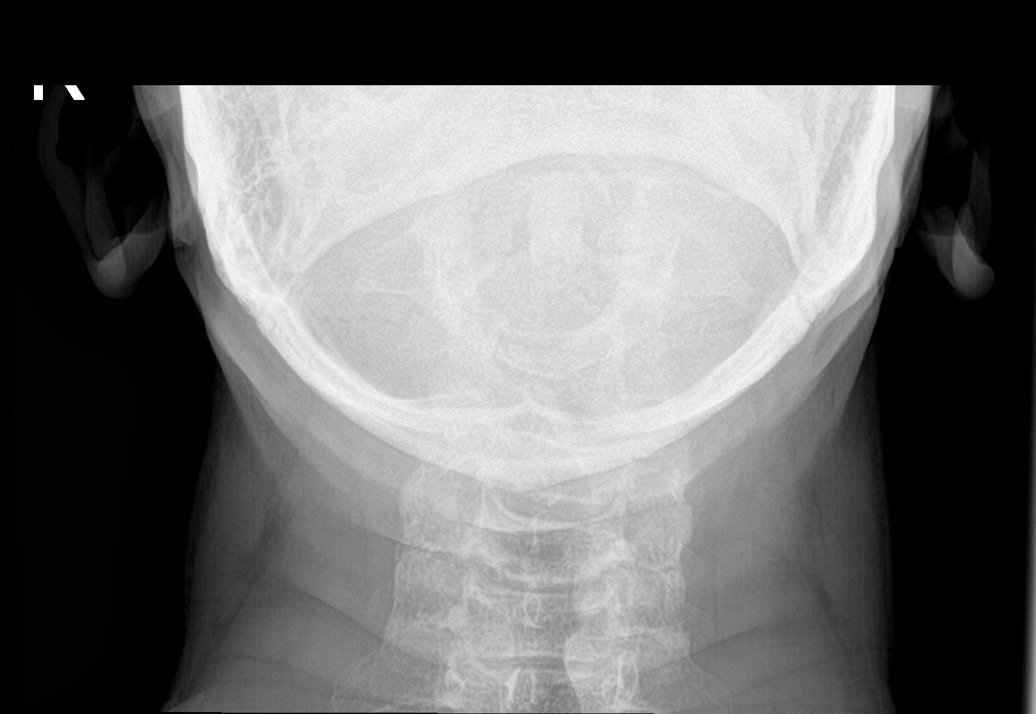

[6 of 6 positions shown; findings below may reference images not displayed]

FINDINGS: Decreased osseous mineralization. No prevertebral soft tissue
swelling. Minor degenerative listhesis. Multilevel degenerative
endplate irregularity. Multilevel disc space narrowing. Facet and
uncovertebral hypertrophy are present and encroach on the neural
foramina bilaterally.
IMPRESSION: Moderate to marked cervical spondylosis.

## 2022-12-24 DIAGNOSIS — Z125 Encounter for screening for malignant neoplasm of prostate: Secondary | ICD-10-CM | POA: Diagnosis not present

## 2022-12-24 DIAGNOSIS — R739 Hyperglycemia, unspecified: Secondary | ICD-10-CM | POA: Diagnosis not present

## 2022-12-24 DIAGNOSIS — Z13220 Encounter for screening for lipoid disorders: Secondary | ICD-10-CM | POA: Diagnosis not present

## 2022-12-24 DIAGNOSIS — R7989 Other specified abnormal findings of blood chemistry: Secondary | ICD-10-CM | POA: Diagnosis not present

## 2022-12-28 DIAGNOSIS — G8918 Other acute postprocedural pain: Secondary | ICD-10-CM | POA: Diagnosis not present

## 2022-12-28 DIAGNOSIS — M545 Low back pain, unspecified: Secondary | ICD-10-CM | POA: Diagnosis not present

## 2022-12-31 DIAGNOSIS — M51362 Other intervertebral disc degeneration, lumbar region with discogenic back pain and lower extremity pain: Secondary | ICD-10-CM | POA: Diagnosis not present

## 2022-12-31 DIAGNOSIS — R030 Elevated blood-pressure reading, without diagnosis of hypertension: Secondary | ICD-10-CM | POA: Diagnosis not present

## 2022-12-31 DIAGNOSIS — Z0000 Encounter for general adult medical examination without abnormal findings: Secondary | ICD-10-CM | POA: Diagnosis not present

## 2022-12-31 DIAGNOSIS — Z87891 Personal history of nicotine dependence: Secondary | ICD-10-CM | POA: Diagnosis not present

## 2023-01-11 DIAGNOSIS — G8918 Other acute postprocedural pain: Secondary | ICD-10-CM | POA: Diagnosis not present

## 2023-01-11 DIAGNOSIS — M545 Low back pain, unspecified: Secondary | ICD-10-CM | POA: Diagnosis not present

## 2023-01-17 DIAGNOSIS — M545 Low back pain, unspecified: Secondary | ICD-10-CM | POA: Diagnosis not present

## 2023-01-17 DIAGNOSIS — G8918 Other acute postprocedural pain: Secondary | ICD-10-CM | POA: Diagnosis not present

## 2023-01-22 ENCOUNTER — Other Ambulatory Visit (HOSPITAL_COMMUNITY): Payer: Self-pay

## 2023-01-22 DIAGNOSIS — Z566 Other physical and mental strain related to work: Secondary | ICD-10-CM | POA: Diagnosis not present

## 2023-01-22 DIAGNOSIS — I10 Essential (primary) hypertension: Secondary | ICD-10-CM | POA: Diagnosis not present

## 2023-01-22 MED ORDER — AMLODIPINE BESYLATE 5 MG PO TABS
5.0000 mg | ORAL_TABLET | Freq: Every day | ORAL | 1 refills | Status: AC
  Filled 2023-01-22: qty 30, 30d supply, fill #0

## 2023-02-15 ENCOUNTER — Other Ambulatory Visit (HOSPITAL_COMMUNITY): Payer: Self-pay

## 2023-02-15 MED ORDER — CELECOXIB 200 MG PO CAPS
200.0000 mg | ORAL_CAPSULE | Freq: Every day | ORAL | 2 refills | Status: AC | PRN
  Filled 2023-02-15: qty 30, 30d supply, fill #0
  Filled 2023-06-17: qty 30, 30d supply, fill #1

## 2023-02-21 ENCOUNTER — Other Ambulatory Visit (HOSPITAL_COMMUNITY): Payer: Self-pay

## 2023-02-21 DIAGNOSIS — I10 Essential (primary) hypertension: Secondary | ICD-10-CM | POA: Diagnosis not present

## 2023-02-21 MED ORDER — LOSARTAN POTASSIUM 50 MG PO TABS
50.0000 mg | ORAL_TABLET | Freq: Every day | ORAL | 3 refills | Status: AC
  Filled 2023-02-21: qty 90, 90d supply, fill #0
  Filled 2023-06-17: qty 90, 90d supply, fill #1
  Filled 2024-01-01: qty 90, 90d supply, fill #2

## 2023-02-21 MED ORDER — AMLODIPINE BESYLATE 10 MG PO TABS
10.0000 mg | ORAL_TABLET | Freq: Every day | ORAL | 1 refills | Status: AC
  Filled 2023-02-21: qty 90, 90d supply, fill #0
  Filled 2023-06-17: qty 18, 18d supply, fill #1
  Filled 2023-06-17: qty 72, 72d supply, fill #1

## 2023-03-26 DIAGNOSIS — I10 Essential (primary) hypertension: Secondary | ICD-10-CM | POA: Diagnosis not present

## 2023-04-20 ENCOUNTER — Emergency Department (HOSPITAL_BASED_OUTPATIENT_CLINIC_OR_DEPARTMENT_OTHER)

## 2023-04-20 ENCOUNTER — Emergency Department (HOSPITAL_BASED_OUTPATIENT_CLINIC_OR_DEPARTMENT_OTHER)
Admission: EM | Admit: 2023-04-20 | Discharge: 2023-04-20 | Disposition: A | Discharge status: Home / Self Care | Attending: Emergency Medicine | Admitting: Emergency Medicine

## 2023-04-20 ENCOUNTER — Encounter (HOSPITAL_BASED_OUTPATIENT_CLINIC_OR_DEPARTMENT_OTHER): Payer: Self-pay | Admitting: Emergency Medicine

## 2023-04-20 ENCOUNTER — Other Ambulatory Visit: Payer: Self-pay

## 2023-04-20 DIAGNOSIS — N132 Hydronephrosis with renal and ureteral calculous obstruction: Secondary | ICD-10-CM | POA: Insufficient documentation

## 2023-04-20 DIAGNOSIS — K5730 Diverticulosis of large intestine without perforation or abscess without bleeding: Secondary | ICD-10-CM | POA: Diagnosis not present

## 2023-04-20 DIAGNOSIS — J449 Chronic obstructive pulmonary disease, unspecified: Secondary | ICD-10-CM | POA: Insufficient documentation

## 2023-04-20 DIAGNOSIS — N200 Calculus of kidney: Secondary | ICD-10-CM

## 2023-04-20 DIAGNOSIS — N134 Hydroureter: Secondary | ICD-10-CM | POA: Diagnosis not present

## 2023-04-20 DIAGNOSIS — R932 Abnormal findings on diagnostic imaging of liver and biliary tract: Secondary | ICD-10-CM | POA: Diagnosis not present

## 2023-04-20 DIAGNOSIS — R1032 Left lower quadrant pain: Secondary | ICD-10-CM | POA: Diagnosis present

## 2023-04-20 LAB — URINALYSIS, MICROSCOPIC (REFLEX): RBC / HPF: >50 RBC/hpf (ref 0–5)

## 2023-04-20 LAB — CBC WITH DIFFERENTIAL/PLATELET
Abs Immature Granulocytes: 0.02 10*3/uL (ref 0.00–0.07)
Basophils Absolute: 0 10*3/uL (ref 0.0–0.1)
Basophils Relative: 0 %
Eosinophils Absolute: 0 10*3/uL (ref 0.0–0.5)
Eosinophils Relative: 0 %
HCT: 39.5 % (ref 39.0–52.0)
Hemoglobin: 13.1 g/dL (ref 13.0–17.0)
Immature Granulocytes: 0 %
Lymphocytes Relative: 18 %
Lymphs Abs: 1.2 10*3/uL (ref 0.7–4.0)
MCH: 31.3 pg (ref 26.0–34.0)
MCHC: 33.2 g/dL (ref 30.0–36.0)
MCV: 94.3 fL (ref 80.0–100.0)
Monocytes Absolute: 0.8 10*3/uL (ref 0.1–1.0)
Monocytes Relative: 11 %
Neutro Abs: 4.7 10*3/uL (ref 1.7–7.7)
Neutrophils Relative %: 71 %
Platelets: 223 10*3/uL (ref 150–400)
RBC: 4.19 MIL/uL — ABNORMAL LOW (ref 4.22–5.81)
RDW: 12.3 % (ref 11.5–15.5)
WBC: 6.8 10*3/uL (ref 4.0–10.5)
nRBC: 0 % (ref 0.0–0.2)

## 2023-04-20 LAB — LIPASE, BLOOD: Lipase: 23 U/L (ref 11–51)

## 2023-04-20 LAB — COMPREHENSIVE METABOLIC PANEL
ALT: 21 U/L (ref 0–44)
AST: 29 U/L (ref 15–41)
Albumin: 4.5 g/dL (ref 3.5–5.0)
Alkaline Phosphatase: 68 U/L (ref 38–126)
Anion gap: 11 (ref 5–15)
BUN: 20 mg/dL (ref 8–23)
CO2: 20 mmol/L — ABNORMAL LOW (ref 22–32)
Calcium: 9.8 mg/dL (ref 8.9–10.3)
Chloride: 105 mmol/L (ref 98–111)
Creatinine, Ser: 1.23 mg/dL (ref 0.61–1.24)
GFR, Estimated: >60 mL/min (ref >60)
Glucose, Bld: 108 mg/dL — ABNORMAL HIGH (ref 70–99)
Potassium: 3.7 mmol/L (ref 3.5–5.1)
Sodium: 136 mmol/L (ref 135–145)
Total Bilirubin: 1.1 mg/dL (ref 0.0–1.2)
Total Protein: 8.6 g/dL — ABNORMAL HIGH (ref 6.5–8.1)

## 2023-04-20 LAB — URINALYSIS, ROUTINE W REFLEX MICROSCOPIC
Bilirubin Urine: NEGATIVE
Glucose, UA: NEGATIVE mg/dL
Ketones, ur: NEGATIVE mg/dL
Leukocytes,Ua: NEGATIVE
Nitrite: NEGATIVE
Protein, ur: 30 mg/dL — AB
Specific Gravity, Urine: 1.02 (ref 1.005–1.030)
pH: 8.5 — ABNORMAL HIGH (ref 5.0–8.0)

## 2023-04-20 MED ORDER — KETOROLAC TROMETHAMINE 15 MG/ML IJ SOLN
15.0000 mg | Freq: Once | INTRAMUSCULAR | Status: AC
  Administered 2023-04-20: 15 mg via INTRAVENOUS
  Filled 2023-04-20: qty 1

## 2023-04-20 MED ORDER — ACETAMINOPHEN 500 MG PO TABS: 500.0000 mg | ORAL_TABLET | Freq: Three times a day (TID) | ORAL | 0 refills | Status: AC | PRN

## 2023-04-20 MED ORDER — IOHEXOL 300 MG/ML  SOLN
100.0000 mL | Freq: Once | INTRAMUSCULAR | Status: AC | PRN
  Administered 2023-04-20: 100 mL via INTRAVENOUS

## 2023-04-20 MED ORDER — TAMSULOSIN HCL 0.4 MG PO CAPS
0.4000 mg | ORAL_CAPSULE | Freq: Once | ORAL | Status: AC
  Administered 2023-04-20: 0.4 mg via ORAL
  Filled 2023-04-20: qty 1

## 2023-04-20 MED ORDER — IBUPROFEN 600 MG PO TABS: 600.0000 mg | ORAL_TABLET | Freq: Three times a day (TID) | ORAL | 0 refills | Status: AC | PRN

## 2023-04-20 MED ORDER — OXYCODONE HCL 5 MG PO TABS: 5.0000 mg | ORAL_TABLET | Freq: Four times a day (QID) | ORAL | 0 refills | Status: AC | PRN

## 2023-04-20 MED ORDER — TAMSULOSIN HCL 0.4 MG PO CAPS: 0.4000 mg | ORAL_CAPSULE | Freq: Every day | ORAL | 0 refills | Status: AC

## 2023-04-20 MED ORDER — OXYCODONE-ACETAMINOPHEN 5-325 MG PO TABS
1.0000 | ORAL_TABLET | Freq: Once | ORAL | Status: AC
  Administered 2023-04-20: 1 via ORAL
  Filled 2023-04-20: qty 1

## 2023-04-20 MED ORDER — ONDANSETRON 4 MG PO TBDP: 4.0000 mg | ORAL_TABLET | Freq: Three times a day (TID) | ORAL | 0 refills | Status: AC | PRN

## 2023-04-20 NOTE — Discharge Instructions (Addendum)
 As discussed, you have a 5 mm kidney stone in the left lower ureter.  It can take up to 14 days for a stone to pass.   Alternate between Ibuprofen and Tylenol every 4 hours for pain.  Take Oxycodone every 8 hours as needed for breakthrough pain. Take Flomax every morning to help with the passage of the stone and Zofran every 8 hours as needed for nausea/vomiting.  I have provided information for urology to follow up with.  Get help right away if: You have a fever or chills. You develop severe pain. You develop new abdominal pain. You faint. You are unable to urinate.

## 2023-04-20 NOTE — ED Notes (Signed)
 Called lab, will add on urine culture

## 2023-04-20 NOTE — ED Provider Notes (Signed)
 [place] [facility] Provider Note   CSN: [id number] Arrival date & time: 04/20/23  1422     History  Chief Complaint  Patient presents with   Abdominal Pain    Cory Jennings is a 63 y.o. male with history of COPD and low back pain who presents the ED today with abdominal pain.  Patient reports he was removing a tree branches from his backyard last night when he started having pain in his lower left abdomen.  States that the pain went away but came back again this morning after eating is been persistent ever since. Endorses associated nausea without vomiting. Denies any changes to urinary or bowel habits.  No fevers.  No pain radiating to the back.  He took a Tramadol earlier today without improvement of symptoms.      Home Medications Prior to Admission medications   Medication Sig Start Date End Date Taking? Authorizing Provider  acetaminophen (TYLENOL) 500 MG tablet Take 1 tablet (500 mg total) by mouth every 8 (eight) hours as needed for up to 14 days. 04/20/23 05/04/23 Yes Maxwell Marion, PA-C  ibuprofen (ADVIL) 600 MG tablet Take 1 tablet (600 mg total) by mouth every 8 (eight) hours as needed for up to 14 days. 04/20/23 05/04/23 Yes Maxwell Marion, PA-C  ondansetron (ZOFRAN-ODT) 4 MG disintegrating tablet Take 1 tablet (4 mg total) by mouth every 8 (eight) hours as needed for up to 14 days for nausea or vomiting. 04/20/23 05/04/23 Yes Maxwell Marion, PA-C  oxyCODONE (ROXICODONE) 5 MG immediate release tablet Take 1 tablet (5 mg total) by mouth every 6 (six) hours as needed for up to 5 days for severe pain (pain score 7-10) or breakthrough pain. 04/20/23 04/25/23 Yes Maxwell Marion, PA-C  tamsulosin (FLOMAX) 0.4 MG CAPS capsule Take 1 capsule (0.4 mg total) by mouth daily for 14 days. 04/20/23 05/04/23 Yes Maxwell Marion, PA-C  albuterol (VENTOLIN HFA) 108 (90 Base) MCG/ACT inhaler INHALE ONE PUFF BY MOUTH EVERY 4 HOURS AS NEEDED 04/15/20 10/25/22  Irena Reichmann, DO  amLODipine (NORVASC) 10 MG tablet Take 1 tablet (10 mg total) by mouth daily for blood pressure. 02/21/23     amLODipine (NORVASC) 5 MG tablet Take 1 tablet (5 mg total) by mouth daily for blood pressure 01/22/23     azelastine (ASTELIN) 0.1 % nasal spray Place 1 spray into both nostrils 2 (two) times daily. Use in each nostril as directed Patient taking differently: Place 1 spray into both nostrils 2 (two) times daily as needed. Use in each nostril as directed 01/06/18   Zachery Dauer, NP  celecoxib (CELEBREX) 200 MG capsule Take 1 capsule (200 mg total) by mouth daily as needed. 02/15/23     fexofenadine (ALLEGRA ALLERGY) 180 MG tablet Take 1 tablet (180 mg total) by mouth daily for 7 days. Patient taking differently: Take 180 mg by mouth daily as needed for allergies. 06/06/20 11/05/22  Trevor Iha, FNP  losartan (COZAAR) 50 MG tablet Take 1 tablet (50 mg total) by mouth daily. 02/21/23     methocarbamol (ROBAXIN) 500 MG tablet Take 1 tablet (500 mg total) by mouth every 8 (eight) hours as needed for muscle spasms 11/05/22     ondansetron (ZOFRAN) 4 MG tablet Take 1 tablet (4 mg total) by mouth every 8 (eight) hours as needed for nausea or vomiting. 11/05/22   Venita Lick, MD  ondansetron (ZOFRAN) 4 MG tablet Take 1 tablet (4 mg total) by mouth every 8 (  eight) hours as needed for nausea or vomiting 11/05/22     oxyCODONE-acetaminophen (PERCOCET) 10-325 MG tablet Take 1 tablet by mouth every 6 (six) hours as needed for pain 11/05/22     loratadine (CLARITIN) 10 MG tablet Take 10 mg by mouth daily.  06/06/20  [provider]      Allergies    Penicillins    Review of Systems   Review of Systems  Gastrointestinal:  Positive for abdominal pain.  All other systems reviewed and are negative.   Physical Exam Updated Vital Signs BP (!) 178/91 (BP Location: Right Arm)   Pulse 88   Temp 97.9 F (36.6 C)   Resp 20   Ht 5\' 9"  (1.753 m)   Wt 73.5 kg   SpO2 100%   BMI 23.92 kg/m   Physical Exam Vitals and nursing note reviewed.  Constitutional:      General: He is not in acute distress.    Appearance: Normal appearance.  HENT:     Head: Normocephalic and atraumatic.     Mouth/Throat:     Mouth: Mucous membranes are moist.  Eyes:     Conjunctiva/sclera: Conjunctivae normal.     Pupils: Pupils are equal, round, and reactive to light.  Cardiovascular:     Rate and Rhythm: Normal rate and regular rhythm.     Pulses: Normal pulses.     Heart sounds: Normal heart sounds.  Pulmonary:     Effort: Pulmonary effort is normal.     Breath sounds: Normal breath sounds.  Abdominal:     Palpations: Abdomen is soft.     Tenderness: There is abdominal tenderness. There is no right CVA tenderness or left CVA tenderness.     Comments: LLQ tenderness to palpation  Musculoskeletal:        General: Normal range of motion.     Cervical back: Normal range of motion.  Skin:    General: Skin is warm and dry.     Findings: No rash.  Neurological:     General: No focal deficit present.     Mental Status: He is alert.  Psychiatric:        Mood and Affect: Mood normal.        Behavior: Behavior normal.    ED Results / Procedures / Treatments   Labs (all labs ordered are listed, but only abnormal results are displayed) Labs Reviewed  COMPREHENSIVE METABOLIC PANEL - Abnormal; Notable for the following components:      Result Value   CO2 20 (*)    Glucose, Bld 108 (*)    Total Protein 8.6 (*)    All other components within normal limits  URINALYSIS, ROUTINE W REFLEX MICROSCOPIC - Abnormal; Notable for the following components:   pH 8.5 (*)    Hgb urine dipstick LARGE (*)    Protein, ur 30 (*)    All other components within normal limits  CBC WITH DIFFERENTIAL/PLATELET - Abnormal; Notable for the following components:   RBC 4.19 (*)    All other components within normal limits  URINALYSIS, MICROSCOPIC (REFLEX) - Abnormal; Notable for the following components:    Bacteria, UA MANY (*)    All other components within normal limits  URINE CULTURE  LIPASE, BLOOD    EKG None  Radiology CT ABDOMEN PELVIS W CONTRAST Result Date: 04/20/2023 CLINICAL DATA:  Lower middle abdominal pain since yesterday, progressive worsening, nausea EXAM: CT ABDOMEN AND PELVIS WITH CONTRAST TECHNIQUE: Multidetector CT imaging of the abdomen  and pelvis was performed using the standard protocol following bolus administration of intravenous contrast. RADIATION DOSE REDUCTION: This exam was performed according to the departmental dose-optimization program which includes automated exposure control, adjustment of the mA and/or kV according to patient size and/or use of iterative reconstruction technique. CONTRAST:  OMNIPAQUE IOHEXOL 300 MG/ML  SOLN COMPARISON:  None Available. FINDINGS: Lower chest: No acute pleural or parenchymal lung disease. Hepatobiliary: High attenuation material within the gallbladder consistent with gallbladder sludge. No evidence of calcified gallstones or cholecystitis. Liver is unremarkable. No biliary duct dilation. Pancreas: Unremarkable. No pancreatic ductal dilatation or surrounding inflammatory changes. Spleen: Normal in size without focal abnormality. Adrenals/Urinary Tract: There is an obstructing 5 mm distal left ureteral calculus reference image 67/301, with mild to moderate left-sided hydronephrosis and hydroureter. Mucosal enhancement within the mid to distal left ureter, as well as extensive left perinephric and Peri ureteral fat stranding, may reflect superimposed infection. There are 2 other distinct nonobstructing calculi within the lower pole left kidney, measuring up to 6 mm in size. Nonobstructing calculus lower pole right kidney measures 6 mm. No evidence of obstructive uropathy. Simple 3.4 cm right renal cyst does not require specific follow-up. The adrenals and bladder are grossly unremarkable. Stomach/Bowel: No bowel obstruction or ileus.  Normal appendix right lower quadrant. Diffuse colonic diverticulosis without evidence of diverticulitis. No bowel wall thickening or inflammatory change. Vascular/Lymphatic: No significant vascular findings are present. No enlarged abdominal or pelvic lymph nodes. Reproductive: Mild enlargement of the prostate. Other: No free fluid or free intraperitoneal gas. Small fat containing left inguinal hernia. No bowel herniation. Musculoskeletal: No acute or destructive bony abnormalities. Moderate multilevel lumbar spondylosis greatest from L2-3 through L4-5. Reconstructed images demonstrate no additional findings. IMPRESSION: 1. Obstructing 5 mm distal left ureteral calculus, with mild to moderate left-sided obstructive uropathy. Left ureteral mucosal enhancement as well as left perinephric and Peri ureteral fat stranding may suggest superimposed infection. 2. Other bilateral nonobstructing renal calculi as above. 3. Diffuse colonic diverticulosis without diverticulitis. 4. High attenuation material within the gallbladder consistent with sludge. No evidence of calcified gallstones or acute cholecystitis. 5. Mild enlargement of the prostate. 6. Small fat containing left inguinal hernia.  No bowel herniation. Electronically Signed   By: Sharlet Salina M.D.   On: 04/20/2023 16:20    Procedures Procedures: not indicated.   Medications Ordered in ED Medications  ketorolac (TORADOL) 15 MG/ML injection 15 mg (has no administration in time range)  tamsulosin (FLOMAX) capsule 0.4 mg (has no administration in time range)  oxyCODONE-acetaminophen (PERCOCET/ROXICET) 5-325 MG per tablet 1 tablet (1 tablet Oral Given 04/20/23 1511)  iohexol (OMNIPAQUE) 300 MG/ML solution 100 mL (100 mLs Intravenous Contrast Given 04/20/23 1559)    ED Course/ Medical Decision Making/ A&P                                 Medical Decision Making Amount and/or Complexity of Data Reviewed Labs: ordered. Radiology:  ordered.  Risk Prescription drug management.   This patient presents to the ED for concern of abdominal pain, this involves an extensive number of treatment options, and is a complaint that carries with it a high risk of complications and morbidity.   Differential diagnosis includes: Gastroenteritis, gastritis, IBS, IBD, diverticulitis, bowel obstruction, perforation, hernia, UTI, kidney stone, etc.   Comorbidities  See HPI above   Additional History  Additional history obtained from prior records   Lab Tests  I ordered and personally interpreted labs.  The pertinent results include:   CMP, lipase, and CBC are within normal limits - no acute electrolyte derangement, AKI, infection, or anemia Urine shows large hemoglobin with greater than 50 RBCs and many bacteria without leukocytes or nitrites. Urine culture pending.   Imaging Studies  I ordered imaging studies including CT abdomen/pelvis  I independently visualized and interpreted imaging which showed:  Obstructing 5 mm distal left ureteral calculus with mild-to-moderate left-sided obstructive uropathy.  Left ureteral mucosal enhancement as well as left perinephric and periureteral fat stranding, may suggest infection. Other bilateral nonobstructing renal calculi as above. Diffuse colonic diverticulosis without diverticulitis. Sludge within the gallbladder.  No evidence of gallstones or acute cholecystitis. Mild enlargement of prostate. I agree with the radiologist interpretation   Problem List / ED Course / Critical Interventions / Medication Management  Patient endorses intermittent left lower abdominal pain since last night.  States he was working in his backyard when the pain first started and took Tylenol with some improvement.  States pain came back this morning after eating breakfast.  Reports associated nausea with the pain.  No changes to urinary habits or bowel habits.  No fevers.  No back pain. I ordered  medications including: Percocet for pain  Reevaluation of the patient after these medicines showed that the patient improved.  Flomax and Toradol given prior to discharge for further symptom management. Discussed findings with patient.  All questions were answered.  States he think he might of passed a stone on his own in the past but was never formally diagnosed.  States that this feels similar. Information for urology given for patient to follow-up with for further evaluation. Prescription sent to the pharmacy.   Social Determinants of Health  History of tobacco use   Test / Admission - Considered  Patient is stable and safe for discharge home. Return precautions provided.       Final Clinical Impression(s) / ED Diagnoses Final diagnoses:  Kidney stone    Rx / DC Orders ED Discharge Orders          Ordered    tamsulosin (FLOMAX) 0.4 MG CAPS capsule  Daily        04/20/23 1639    ibuprofen (ADVIL) 600 MG tablet  Every 8 hours PRN        04/20/23 1639    acetaminophen (TYLENOL) 500 MG tablet  Every 8 hours PRN        04/20/23 1639    oxyCODONE (ROXICODONE) 5 MG immediate release tablet  Every 6 hours PRN        04/20/23 1639    ondansetron (ZOFRAN-ODT) 4 MG disintegrating tablet  Every 8 hours PRN        04/20/23 1639              Maxwell Marion, PA-C 04/20/23 1649    Anders Simmonds T, DO 04/21/23 [phone]

## 2023-04-20 NOTE — ED Triage Notes (Signed)
 Pt c/o lower middle abd pain since last night; has progressively worsened; +nausea

## 2023-04-21 LAB — URINE CULTURE: Culture: NO GROWTH

## 2023-05-31 DIAGNOSIS — M545 Low back pain, unspecified: Secondary | ICD-10-CM | POA: Diagnosis not present

## 2023-06-17 ENCOUNTER — Other Ambulatory Visit (HOSPITAL_COMMUNITY): Payer: Self-pay

## 2023-10-25 ENCOUNTER — Other Ambulatory Visit (HOSPITAL_COMMUNITY): Payer: Self-pay

## 2023-11-08 ENCOUNTER — Other Ambulatory Visit (HOSPITAL_COMMUNITY): Payer: Self-pay

## 2023-11-08 DIAGNOSIS — J069 Acute upper respiratory infection, unspecified: Secondary | ICD-10-CM | POA: Diagnosis not present

## 2023-11-08 DIAGNOSIS — M1990 Unspecified osteoarthritis, unspecified site: Secondary | ICD-10-CM | POA: Diagnosis not present

## 2023-11-08 MED ORDER — DOXYCYCLINE HYCLATE 100 MG PO CAPS
100.0000 mg | ORAL_CAPSULE | Freq: Two times a day (BID) | ORAL | 0 refills | Status: AC
  Filled 2023-11-08: qty 20, 10d supply, fill #0

## 2023-11-08 MED ORDER — TRAMADOL HCL 50 MG PO TABS
ORAL_TABLET | ORAL | 1 refills | Status: AC
  Filled 2023-11-08: qty 56, 7d supply, fill #0

## 2023-11-08 MED ORDER — PREDNISONE 10 MG PO TABS
ORAL_TABLET | ORAL | 0 refills | Status: AC
  Filled 2023-11-08: qty 10, 7d supply, fill #0

## 2023-12-25 DIAGNOSIS — R030 Elevated blood-pressure reading, without diagnosis of hypertension: Secondary | ICD-10-CM | POA: Diagnosis not present

## 2023-12-25 DIAGNOSIS — Z13220 Encounter for screening for lipoid disorders: Secondary | ICD-10-CM | POA: Diagnosis not present

## 2023-12-25 DIAGNOSIS — R7989 Other specified abnormal findings of blood chemistry: Secondary | ICD-10-CM | POA: Diagnosis not present

## 2023-12-25 DIAGNOSIS — Z79899 Other long term (current) drug therapy: Secondary | ICD-10-CM | POA: Diagnosis not present

## 2023-12-25 DIAGNOSIS — Z125 Encounter for screening for malignant neoplasm of prostate: Secondary | ICD-10-CM | POA: Diagnosis not present

## 2023-12-25 DIAGNOSIS — R7309 Other abnormal glucose: Secondary | ICD-10-CM | POA: Diagnosis not present

## 2024-01-01 ENCOUNTER — Other Ambulatory Visit (HOSPITAL_COMMUNITY): Payer: Self-pay

## 2024-01-24 DIAGNOSIS — Z1211 Encounter for screening for malignant neoplasm of colon: Secondary | ICD-10-CM | POA: Diagnosis not present

## 2024-01-24 DIAGNOSIS — Z1212 Encounter for screening for malignant neoplasm of rectum: Secondary | ICD-10-CM | POA: Diagnosis not present
